# Patient Record
Sex: Female | Born: 1960 | Race: White | Hispanic: No | Marital: Married
Health system: Northeastern US, Academic
[De-identification: ages and names within clinical notes are randomized; demographics above are authoritative.]

---

## 2018-11-30 IMAGING — CR XR LUMBAR SPINE 2-3 VIEWS
3 series · 3 of 3 positions shown · non-contrast
Comparison: none

EXAM: LUMBOSACRAL SPINE:
CLINICAL INDICATION:  back pain
REFERENCE:  05/26/2010

[AP]
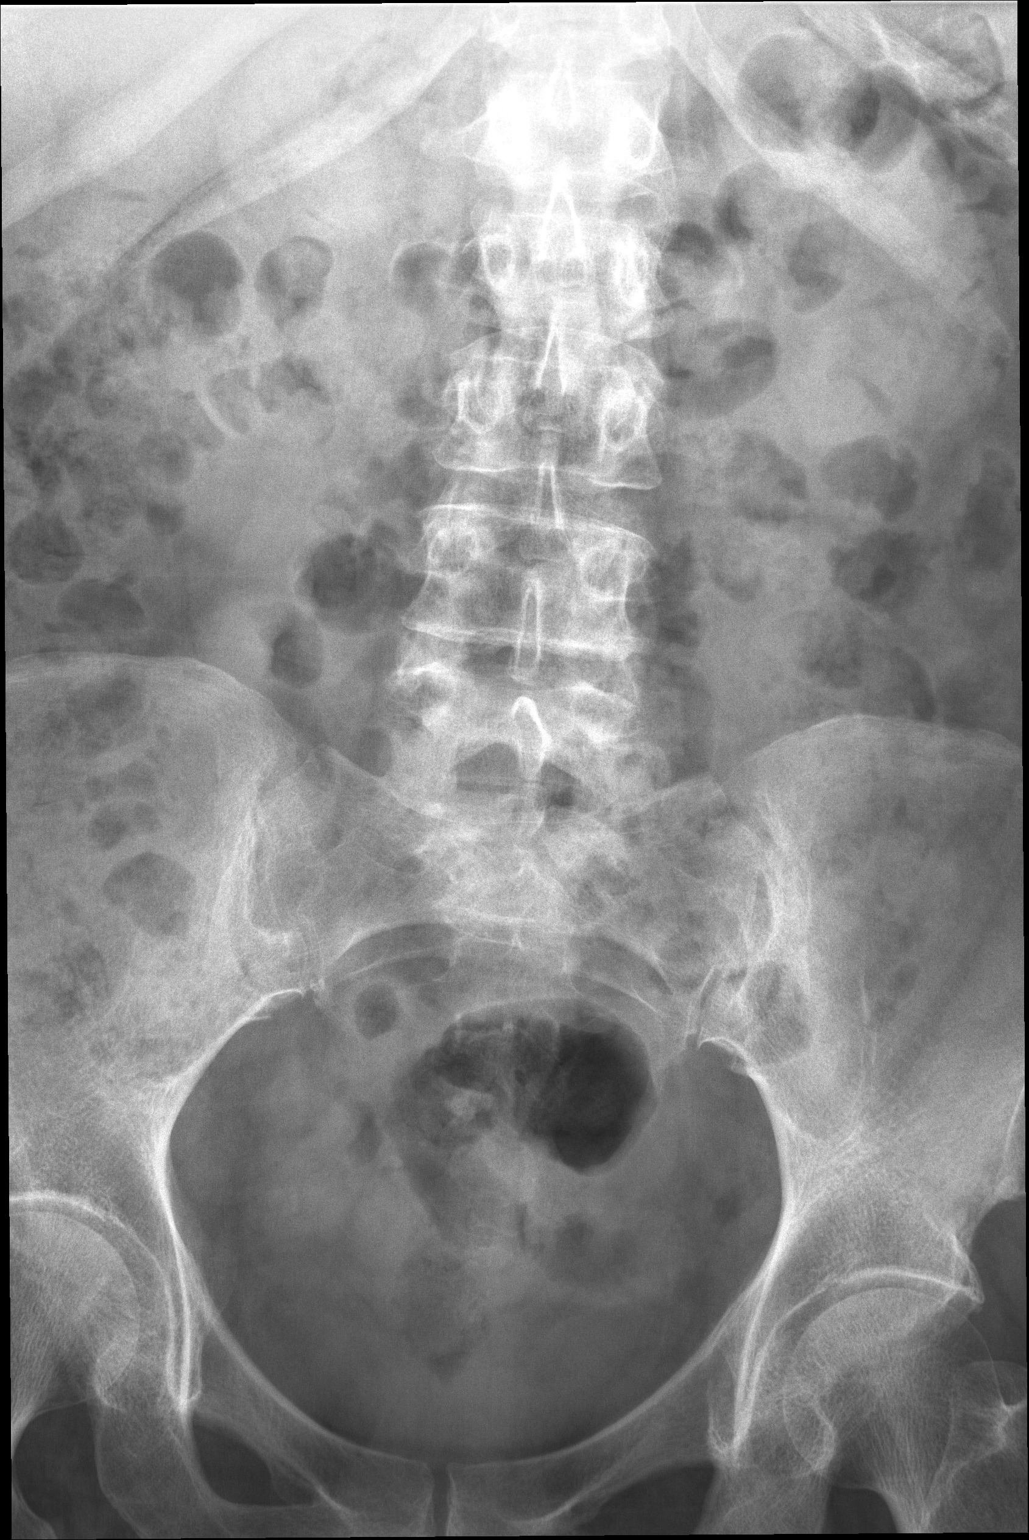

[lateral (1 of 2)]
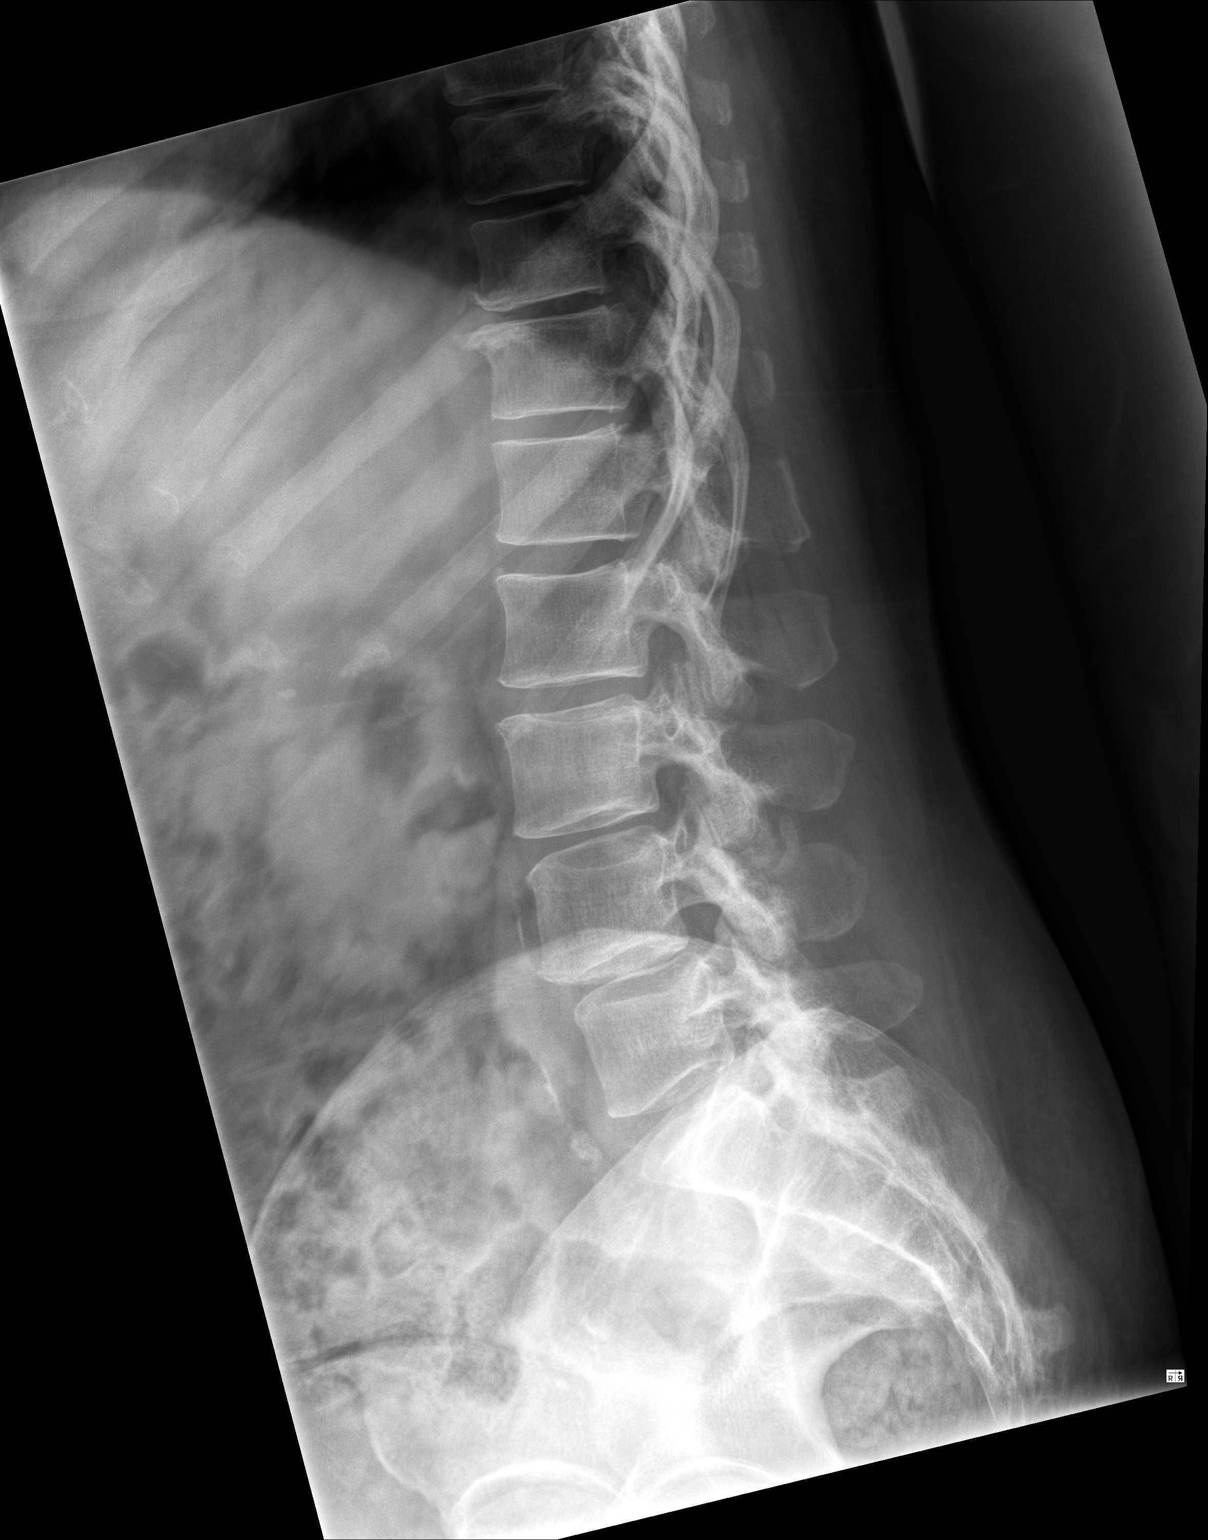

[lateral (2 of 2)]
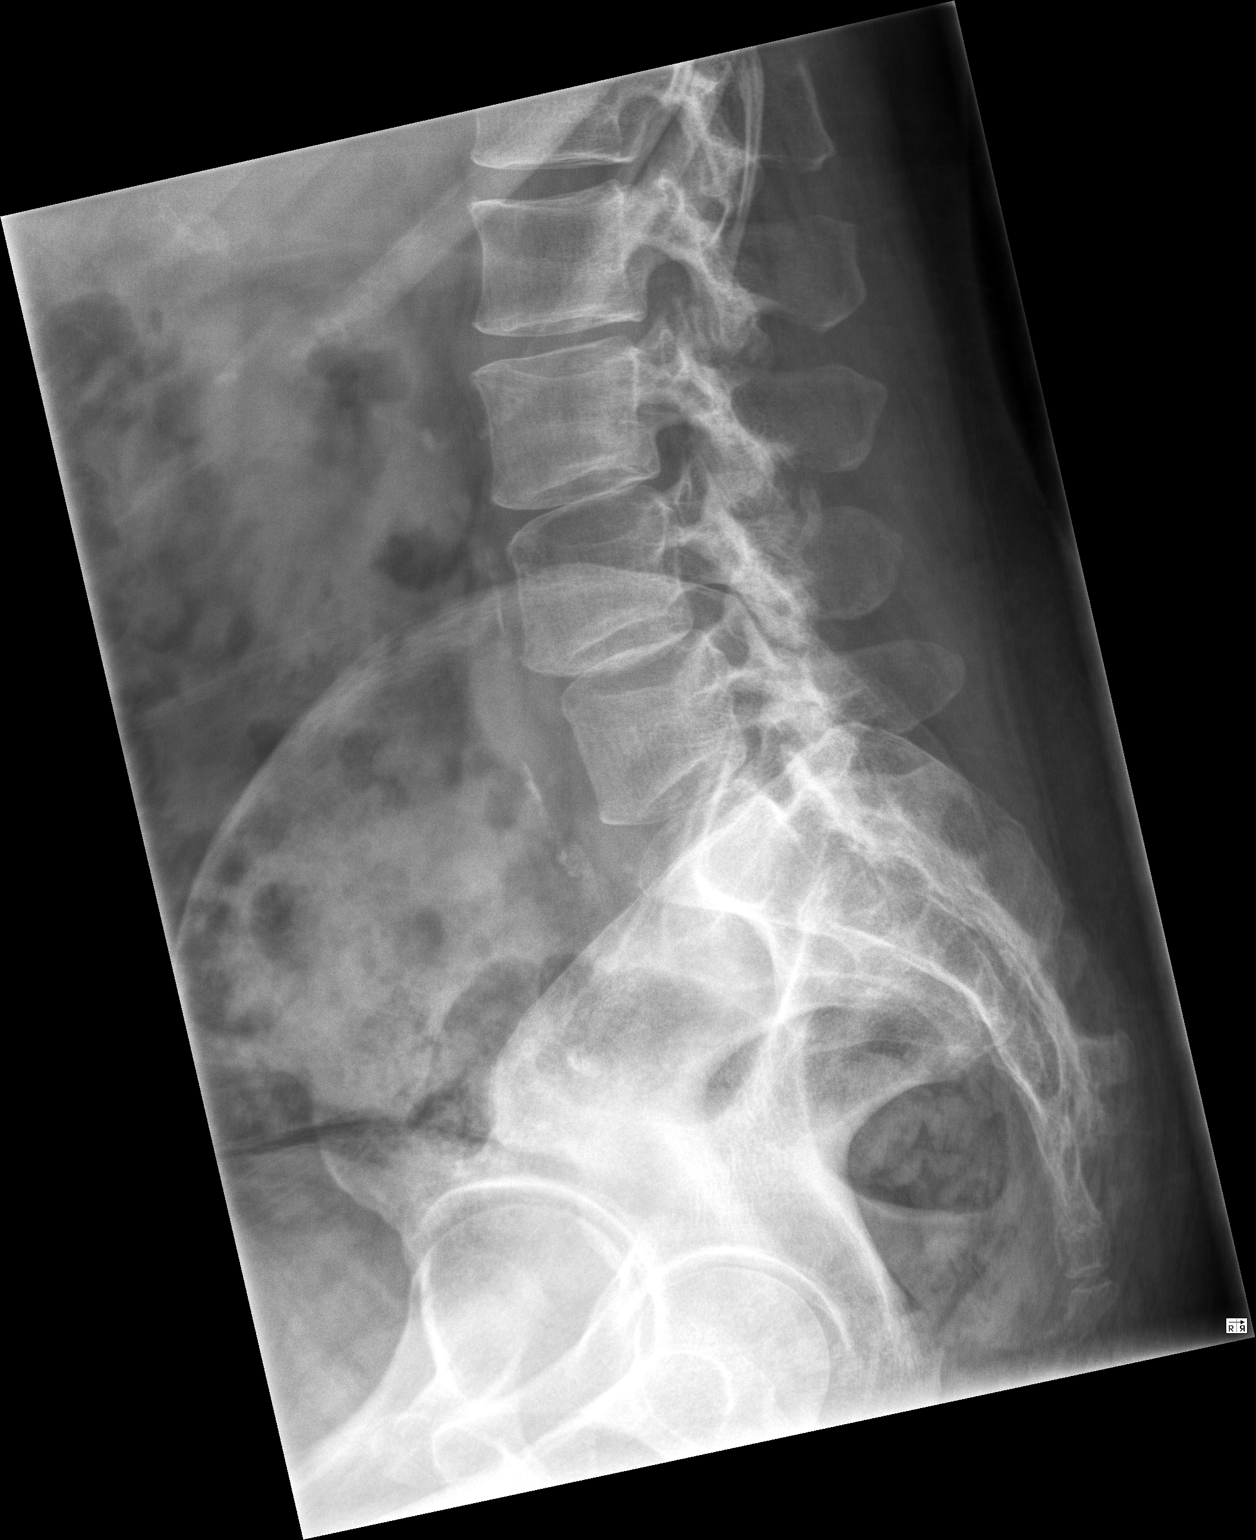

[3 of 3 positions shown; findings below may reference images not displayed]

FINDINGS: AP, lateral and spot lateral views of the lumbar spine are presented.
Bone mineralization is normal. Alignment is anatomic. There is no acute fracture, subluxation or dislocation.
There is progressive mild anterior vertebral body spurring and subchondral sclerosis with subtle anterior disc space narrowing at T11-12.
There is subtle new disc space narrowing and spurring at L2-3, L3-4 and L4-5. There are no other visible degenerative changes. There is mild aortoiliac vascular calcification.
IMPRESSION: 1. Degenerative change, progressive.
2. No acute fracture or dislocation.
LOCATION CODE : 4
Is the patient pregnant?
No

## 2018-11-30 IMAGING — CR XR HIPS BILATERAL 3 OR 4 VW WITH OR WITHOUT PELVIS
4 series · 4 of 4 positions shown · non-contrast
Comparison: none

BILATERAL HIPS:
REFERENCE:  None
CLINICAL INDICATION: Hip pain, osteoarthritis suspected
Left Hip pain, recent fall

[AP (1 of 2)]
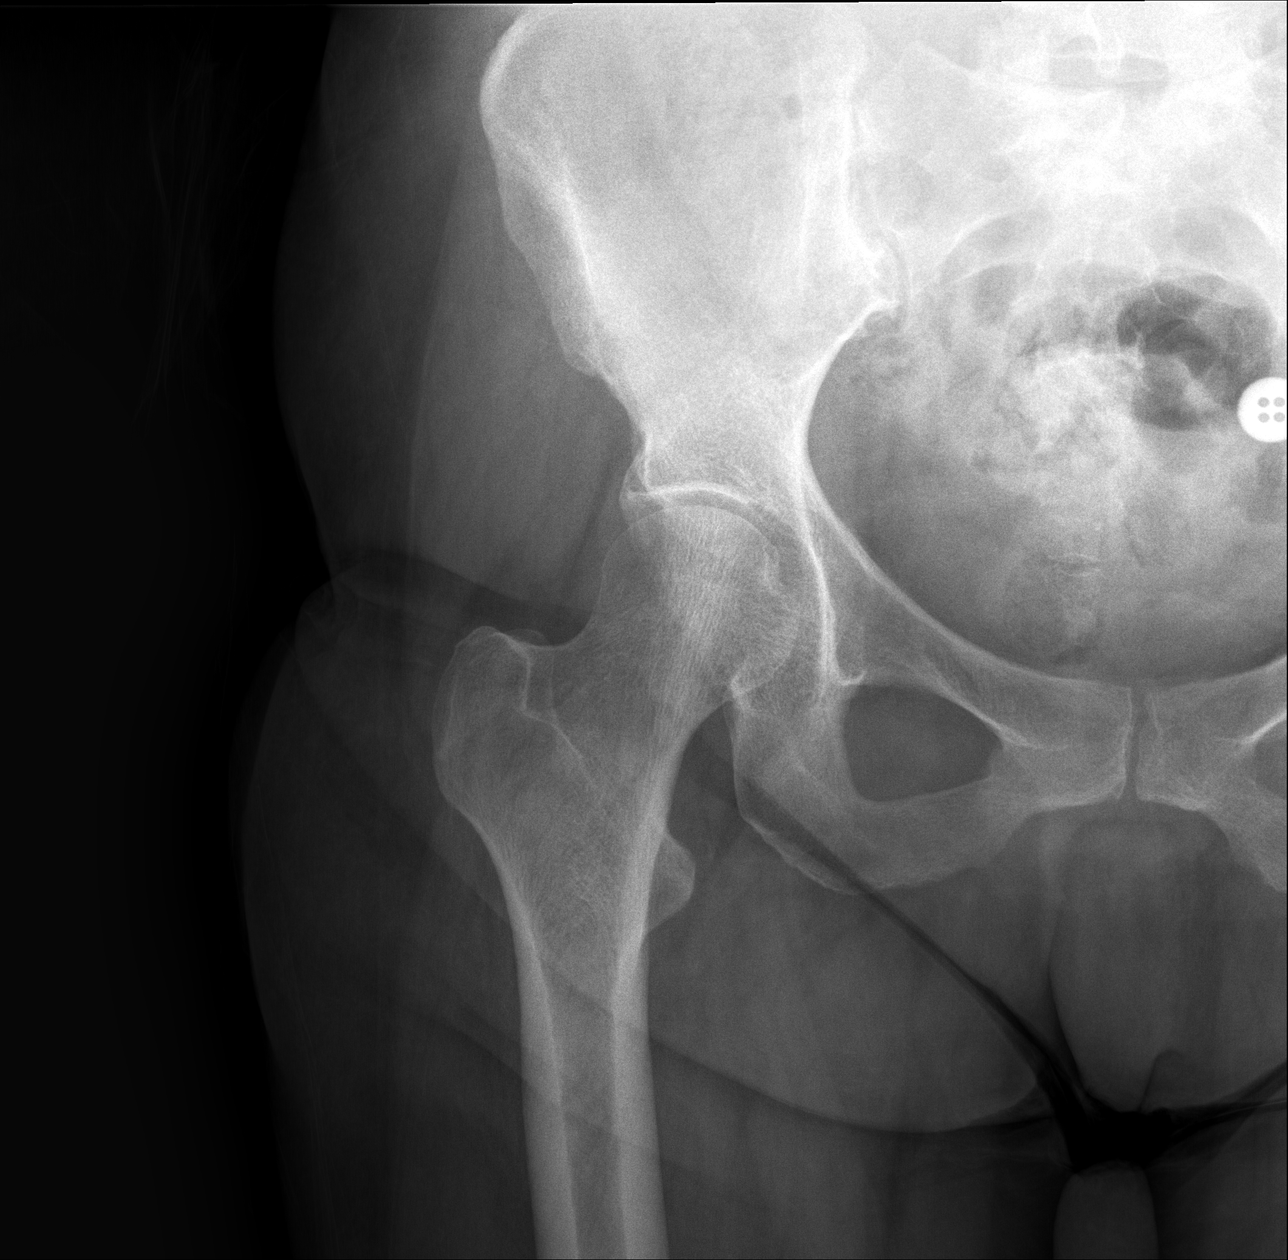

[right lateral]
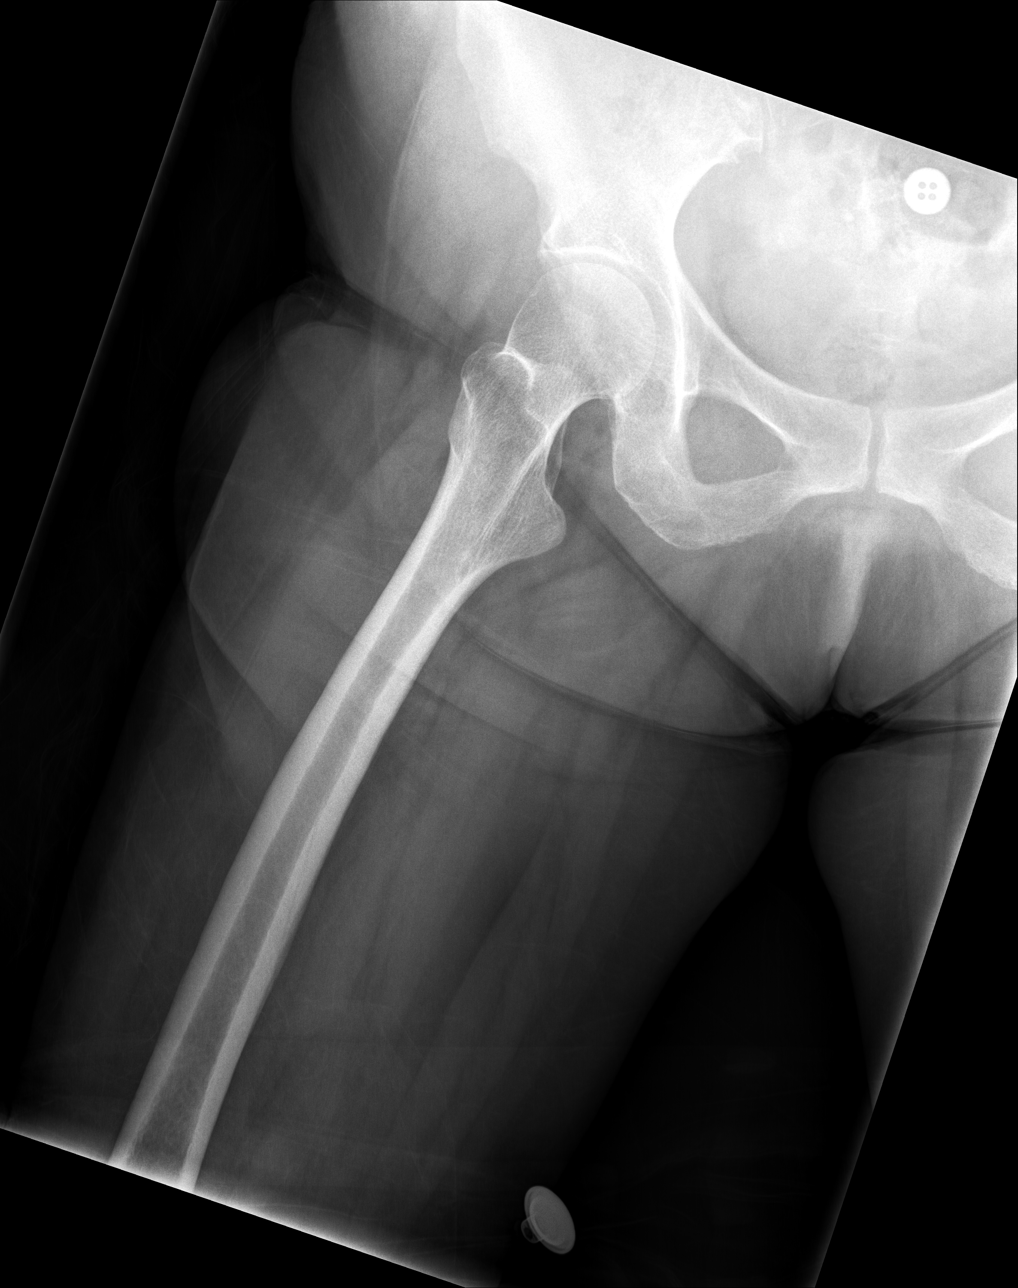

[AP (2 of 2)]
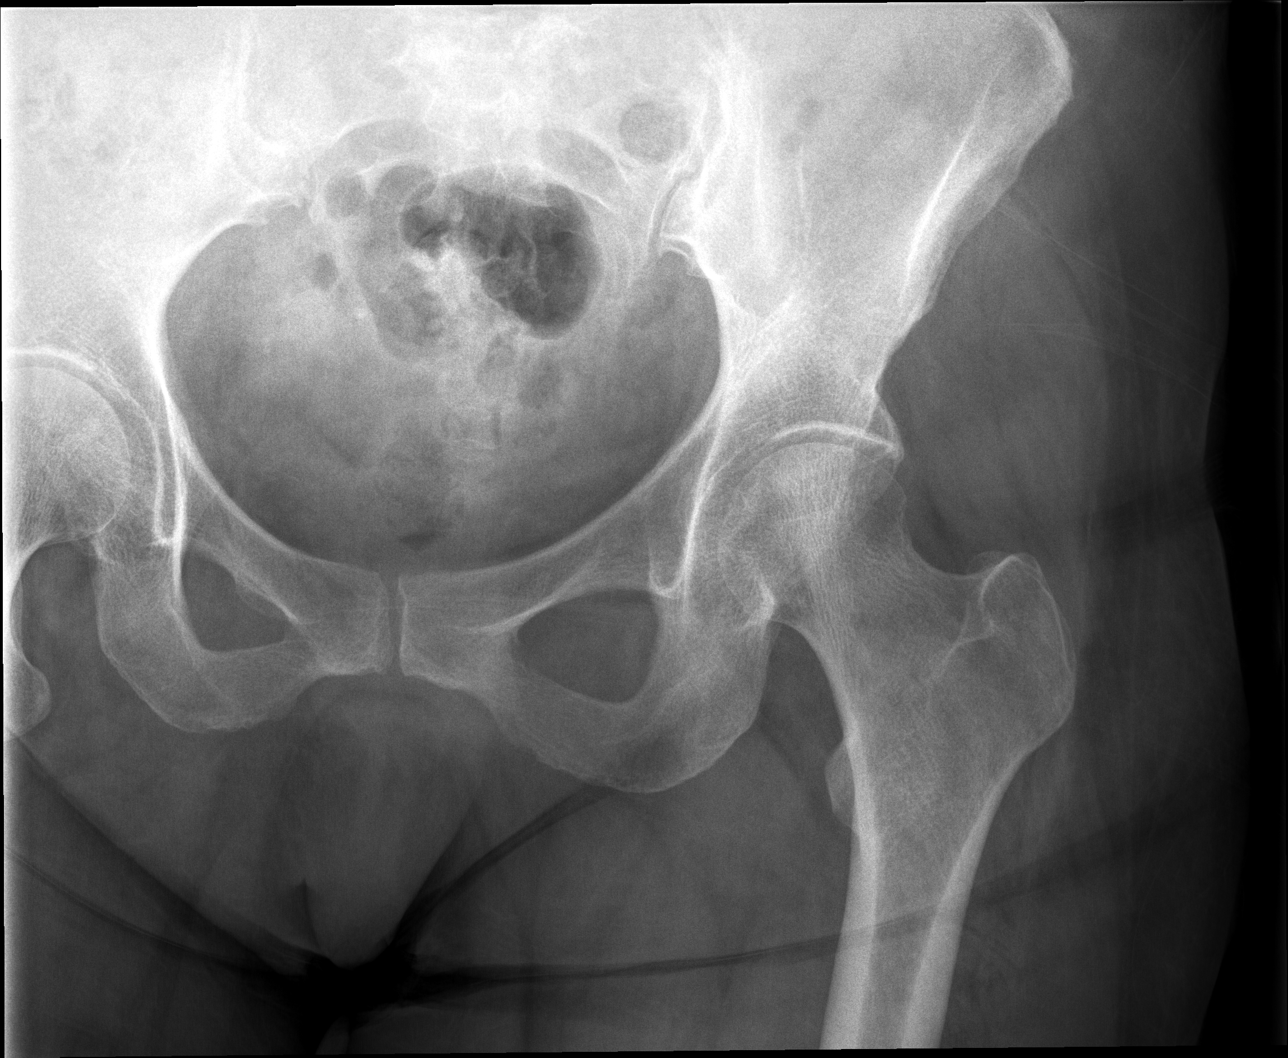

[left lateral]
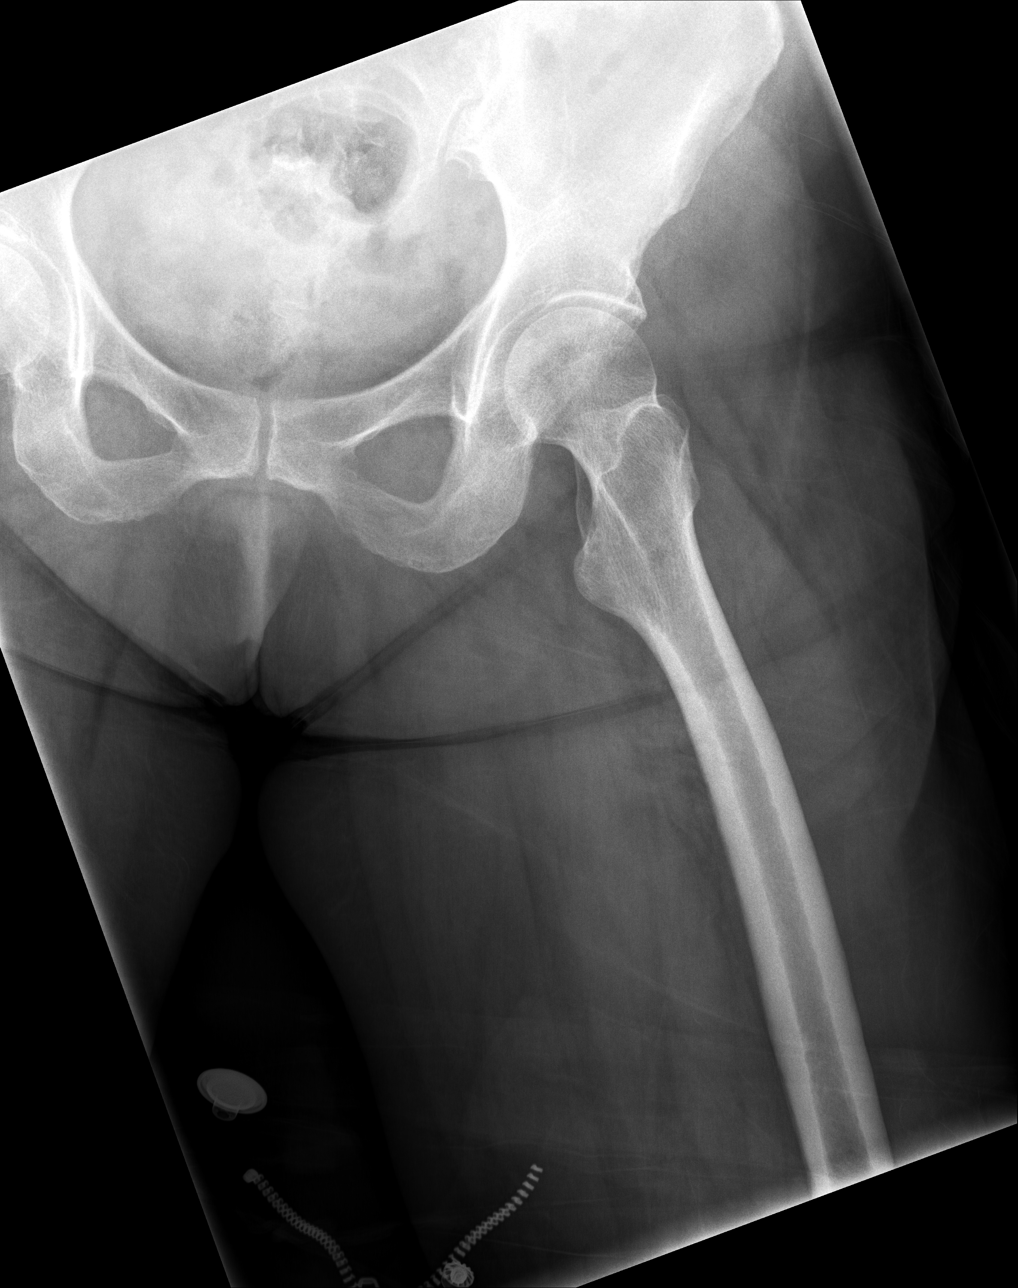

[4 of 4 positions shown; findings below may reference images not displayed]

FINDINGS: AP and frog-leg lateral views of both hips are presented. Bone mineralization appears normal.
There is no visible acute fracture or dislocation.
The hip joint spaces appear normal and symmetric.
The sacroiliac joints and pubic symphysis appear normal for age.
The surrounding soft tissues appear normal.
IMPRESSION: Normal hips.
LOCATION CODE: 4
Is the patient pregnant?
No

## 2019-09-24 IMAGING — CT CT HEAD WO CONTRAST
3 of 4 series · 17 of 30 positions shown, 19 images · non-contrast
Comparison: none

Exam: CT of the brain and ventricles
CT of the brain and ventricles was performed without IV contrast.
Review is made of the exam of 08/19/2008. Review is made of the MRI of the brain and ventricles from 07/09/2015.
Cystic lesion is seen adjacent to the lateral ventricle on the left. This measures approximately 3 cm. This is similar to the previous exam. This likely represents a porencephalic cyst. No positive mass effect, midline shift, or cerebral hemorrhage. No acute CNS changes. Bony calvarium is grossly intact.

[Series 4: brain without · axial · non-contrast · 0.46mm/px · z∈[+98,+214]mm · 6 of 36 slices shown, 8 images (1 of 2)]
[im 6/36  brain]
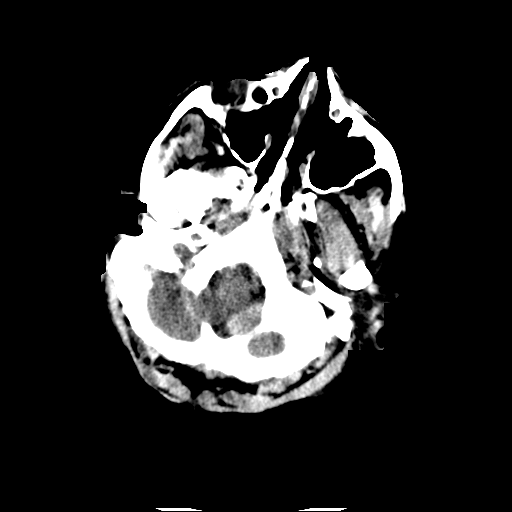
[im 6/36  bone]
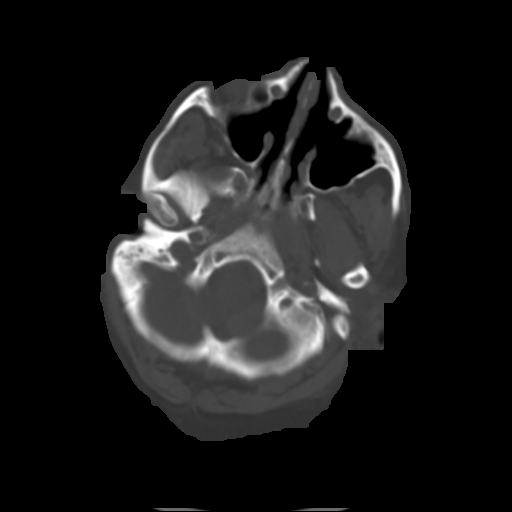
[im 11/36  brain]
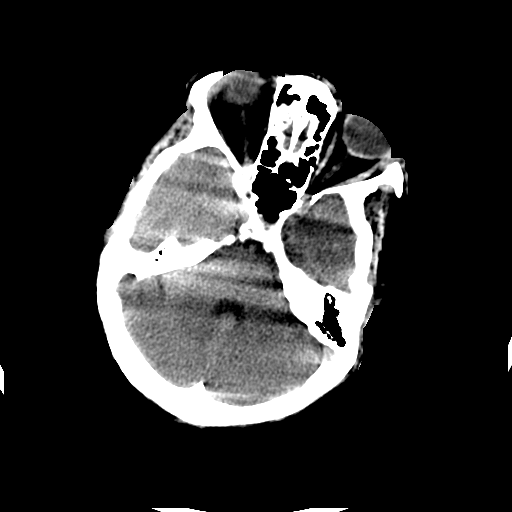
[im 16/36  brain]
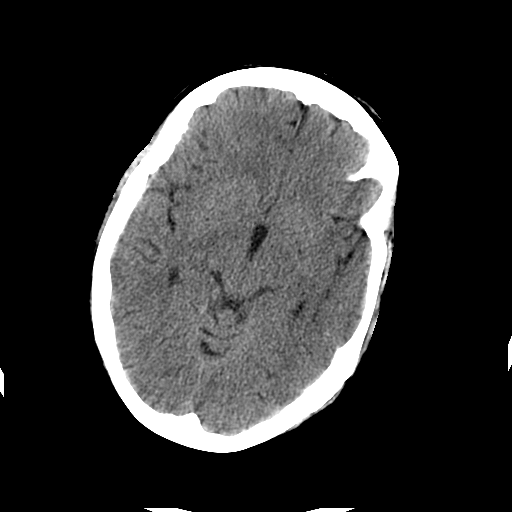
[im 21/36  brain]
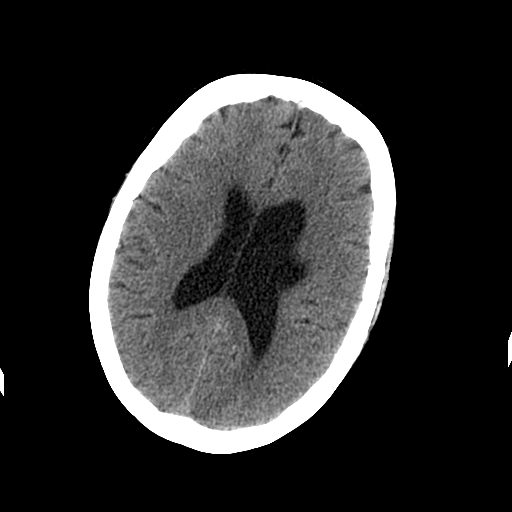
[im 26/36  brain]
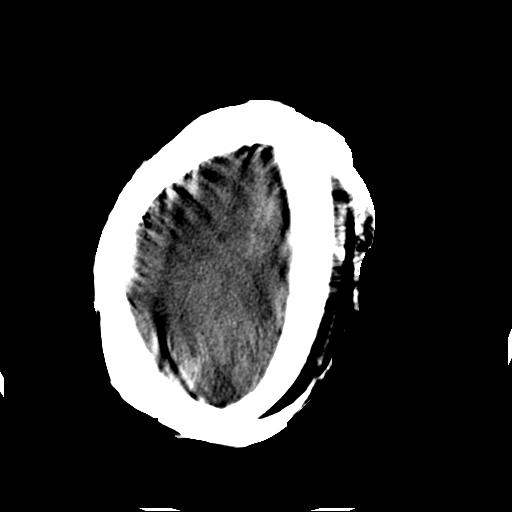
[im 26/36  bone]
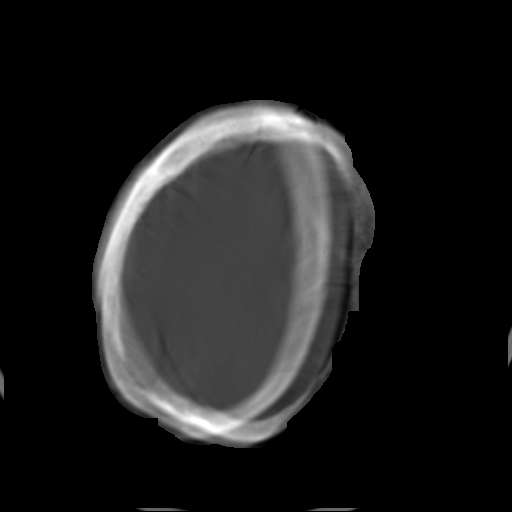
[im 31/36  brain]
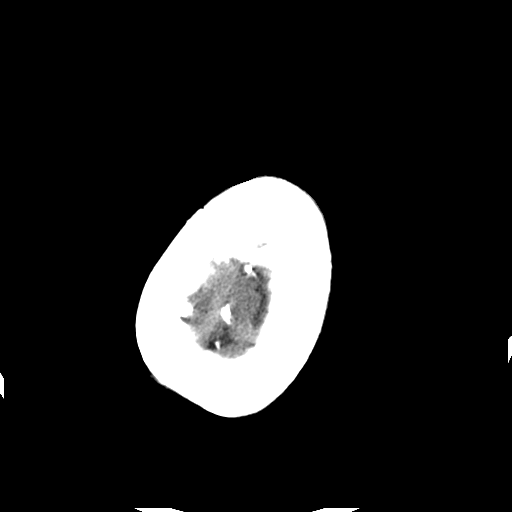

[Series 5: bone · axial · 0.46mm/px · z∈[+98,+214]mm · 6 of 36 slices shown]
[im 6/36  bone]
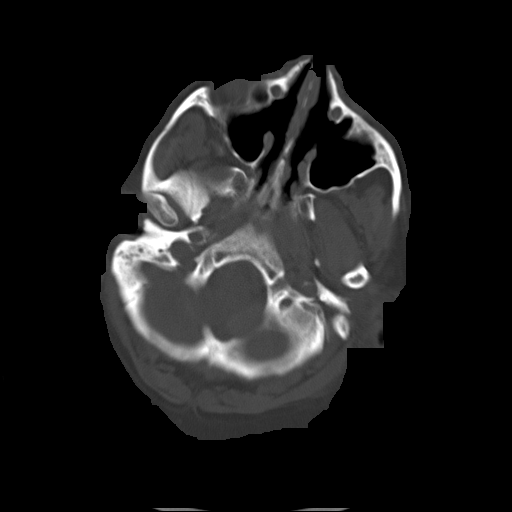
[im 11/36  bone]
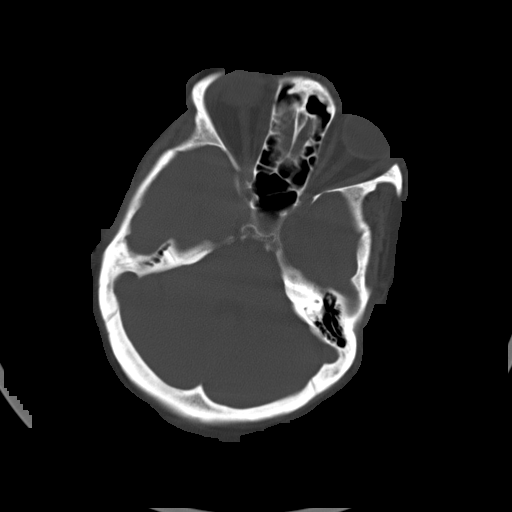
[im 16/36  bone]
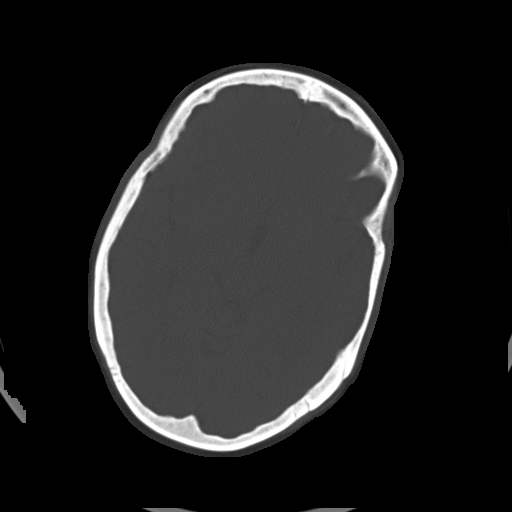
[im 21/36  bone]
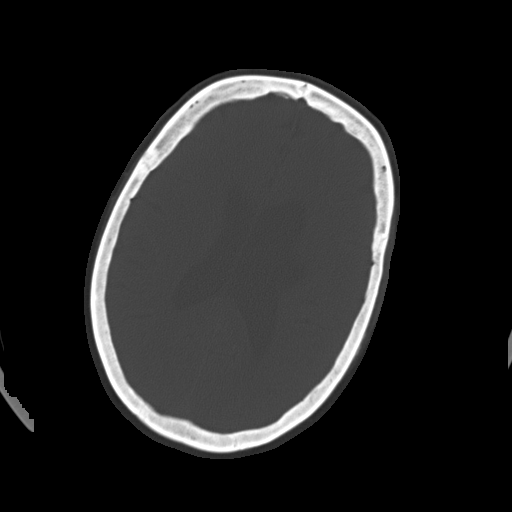
[im 26/36  bone]
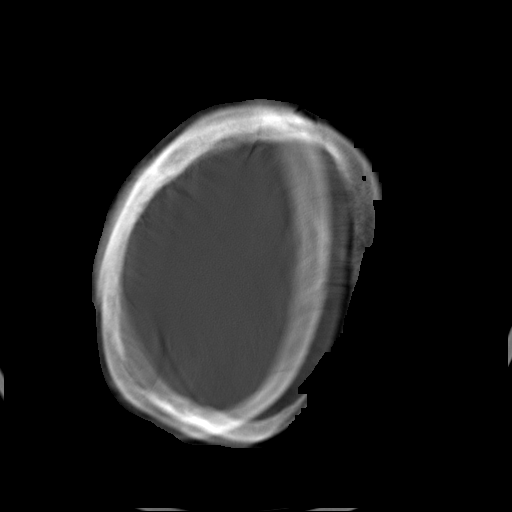
[im 31/36  bone]
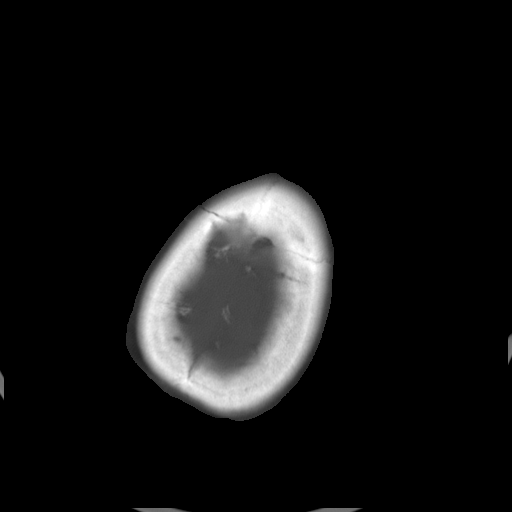

[Series 9: brain without · axial · non-contrast · 0.49mm/px · z∈[+74,+174]mm · 5 of 32 slices shown (2 of 2)]
[im 6/32  brain]
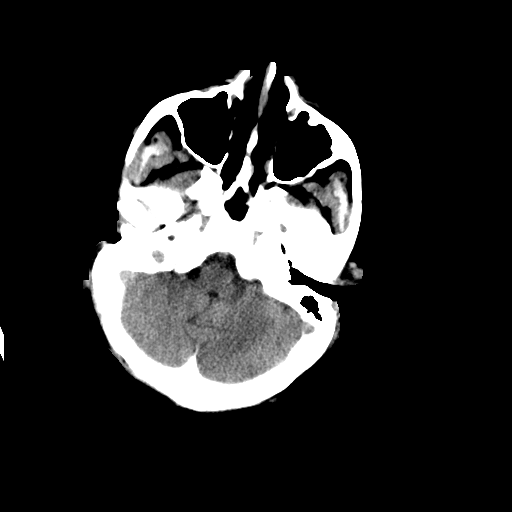
[im 11/32  brain]
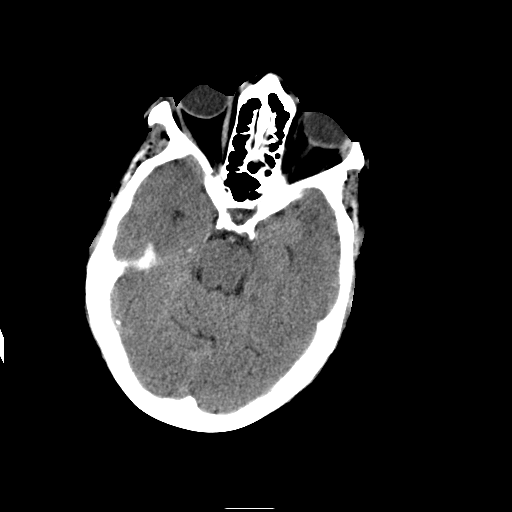
[im 16/32  brain]
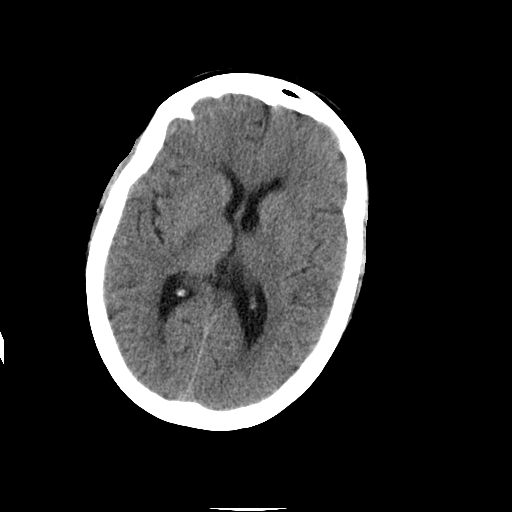
[im 21/32  brain]
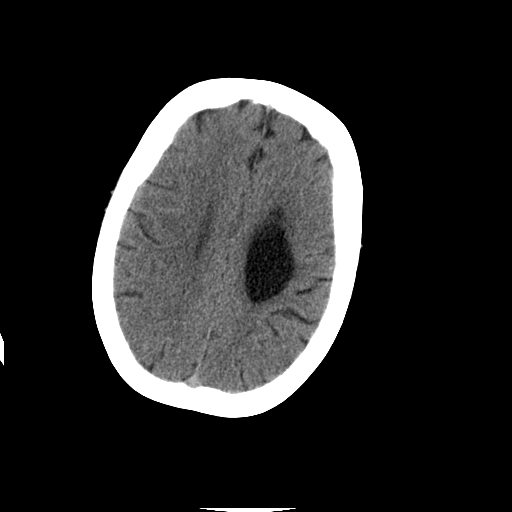
[im 26/32  brain]
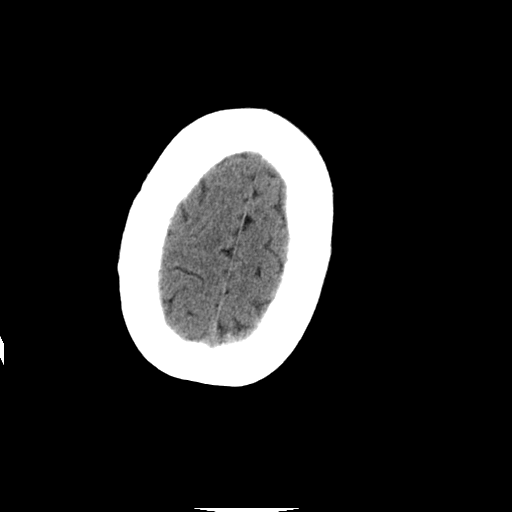

[17 of 30 positions shown; findings below may reference images not displayed]

IMPRESSION: Cystic area adjacent to the left lateral ventricle. This may represent porencephalic cyst. Stable dating back to 5886. No acute CNS changes.
Is the patient pregnant?
No

## 2019-09-24 IMAGING — CR XR CHEST 1 VIEW
1 series · 1 of 1 positions shown · non-contrast
Comparison: none

Exam: Chest one view
Today's exam is compared to the previous study of 10/31/2016.
Heart is normal in size. Mediastinal contours within normal limits. No edema. No pneumothorax. Mild scoliosis.

[AP]
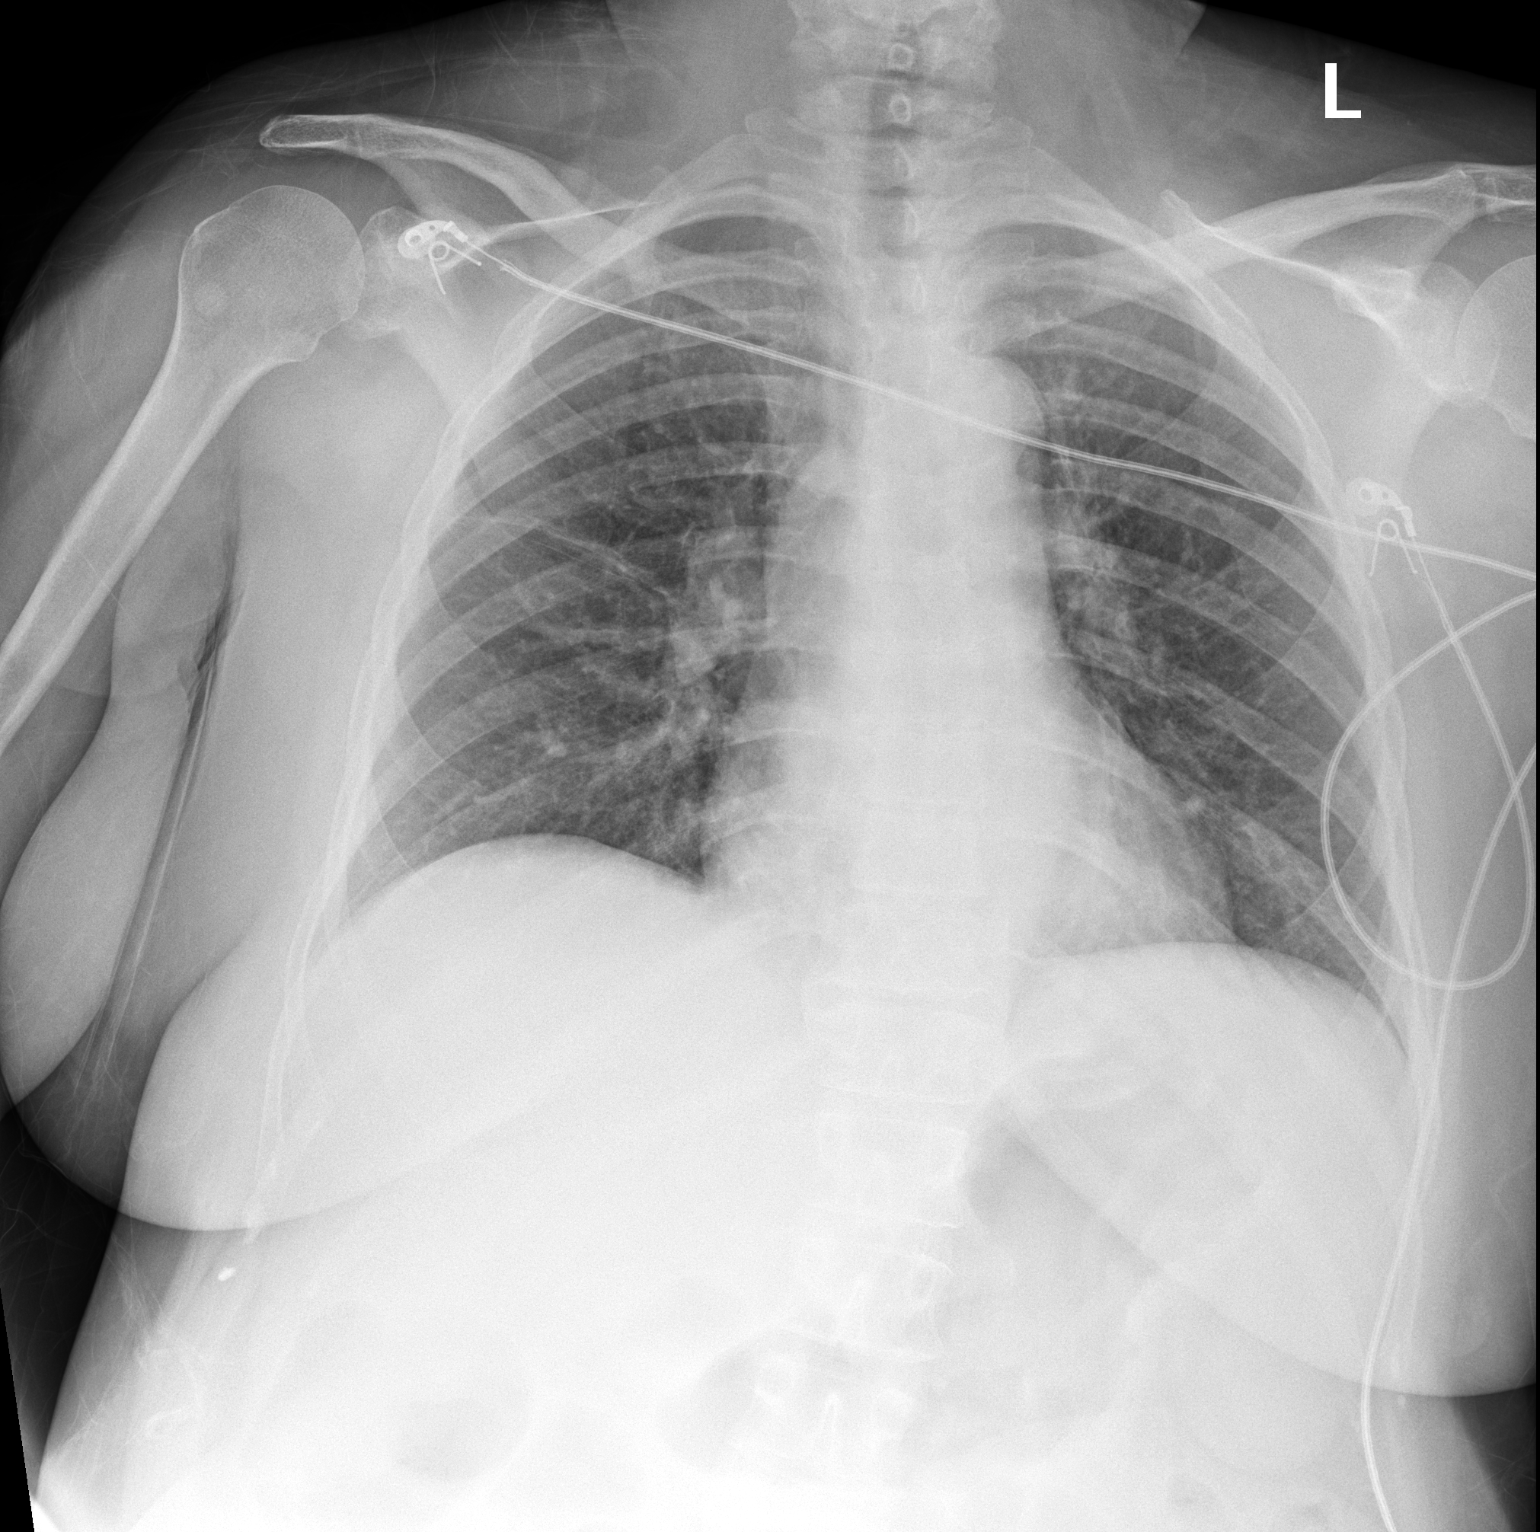

[1 of 1 positions shown; findings below may reference images not displayed]

IMPRESSION: No acute cardia pulmonary process.
Is the patient pregnant?
No

## 2019-09-25 IMAGING — MR MRI BRAIN WO CONTRAST
7 of 12 series · 31 of 48 positions shown · non-contrast
Comparison: none

MRI brain examination without contrast.
Multiplanar multisequence MRI images of brain obtained. This exams compared with 07/09/2015 exam. There is some motion artifact on these images.
There is a focal area of compensatory enlargement at the lateral aspect body of left lateral ventricle as on prior examination. There is hyperintensity demonstrated mild extent within the left ventricle white matter on T2 and FLAIR images as on prior examination. There are small, subcentimeter, foci and small number of foci of hyperintensity involving deep white matter right frontal which are unchanged. There is no diffusion restriction. There is no intracranial hemorrhage. There is no intracranial mass effect.

[Series 2: survey_mpr_sag · sagittal · 1.6mm · 1.60mm/px · 2 of 5 slices shown]
[im 1/5]
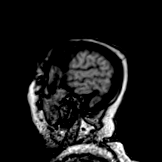
[im 5/5]
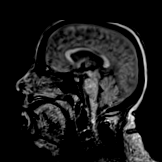

[Series 5: T1 · sagittal · 5.0mm · 0.45mm/px · 4 of 24 slices shown (1 of 3)]
[im 1/24]
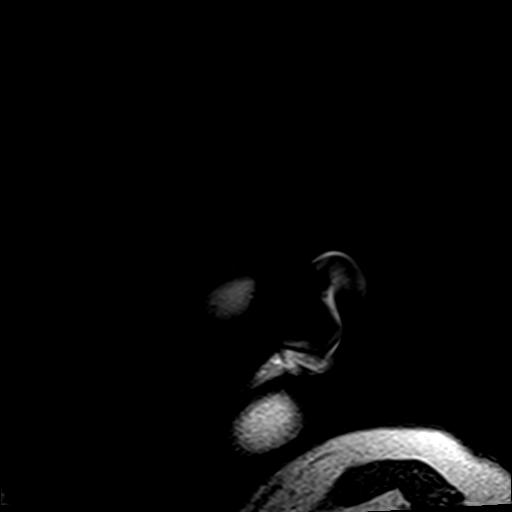
[im 8/24]
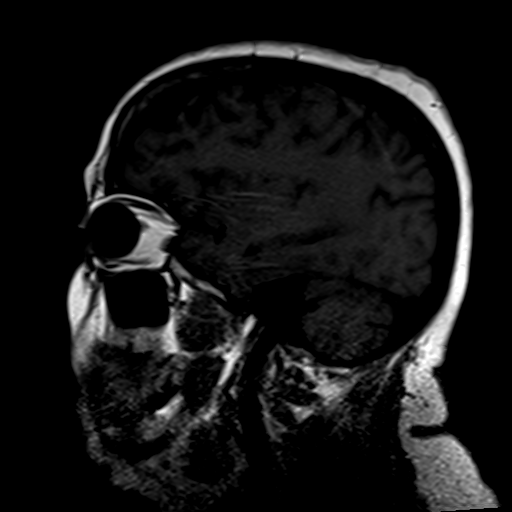
[im 16/24]
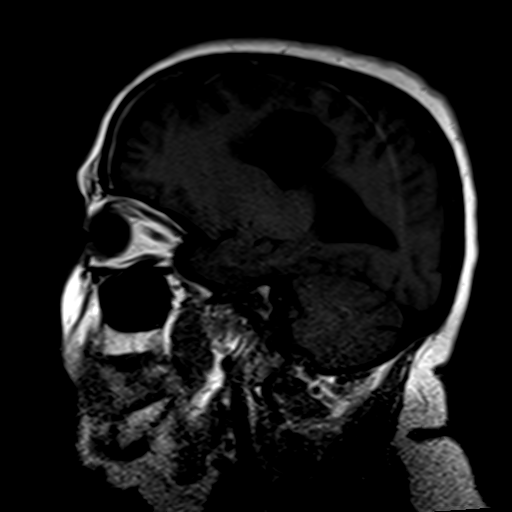
[im 24/24]
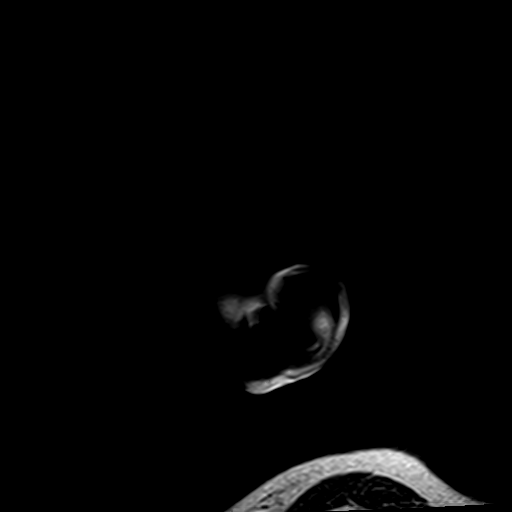

[Series 9: T1 · axial · 4.0mm · 0.45mm/px · z∈[-18,+143]mm · 5 of 30 slices shown (2 of 3)]
[im 1/30]
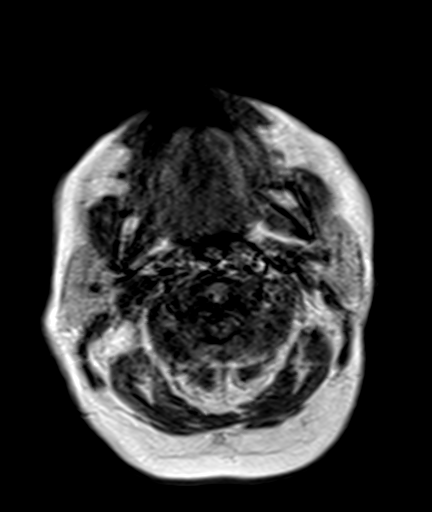
[im 8/30]
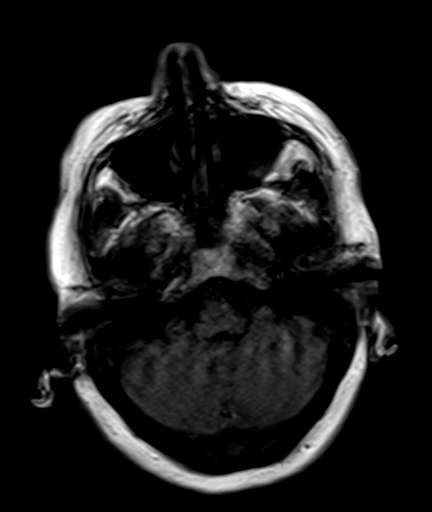
[im 15/30]
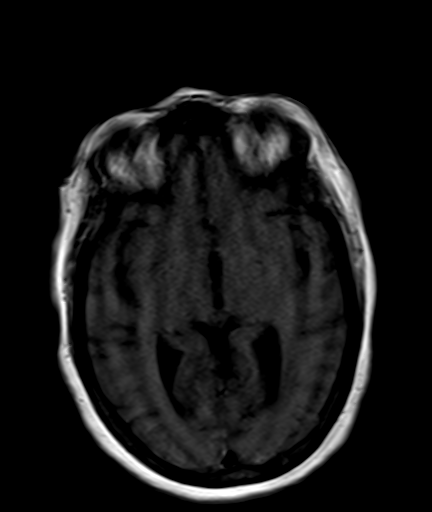
[im 22/30]
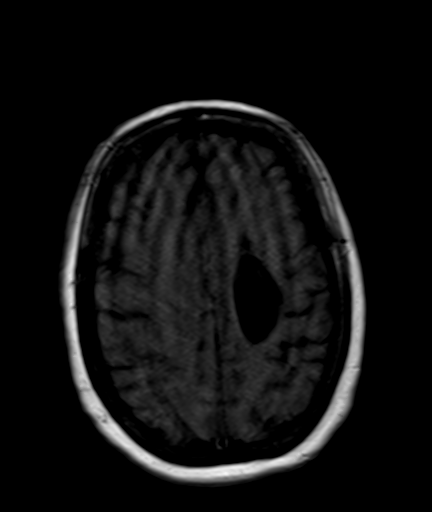
[im 30/30]
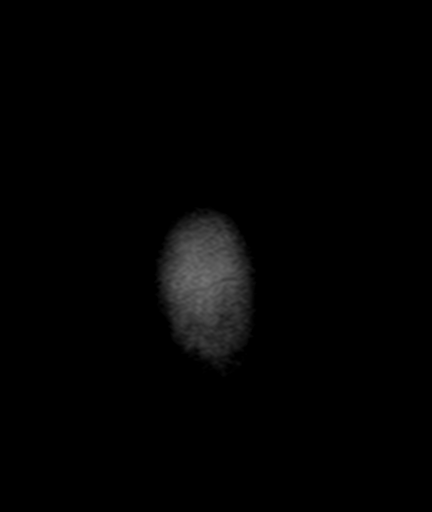

[Series 10: FLAIR · axial · 4.0mm · 0.90mm/px · z∈[-18,+143]mm · 5 of 30 slices shown]
[im 1/30]
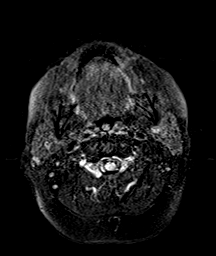
[im 8/30]
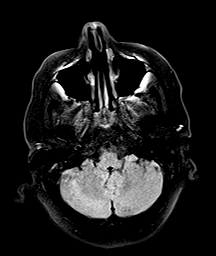
[im 15/30]
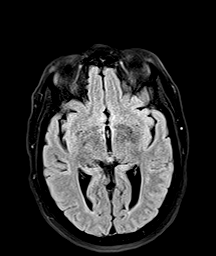
[im 22/30]
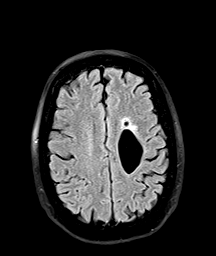
[im 30/30]
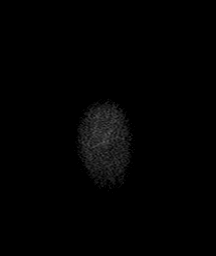

[Series 11: GRE · axial · 4.0mm · 0.45mm/px · z∈[-18,+143]mm · 5 of 30 slices shown]
[im 1/30]
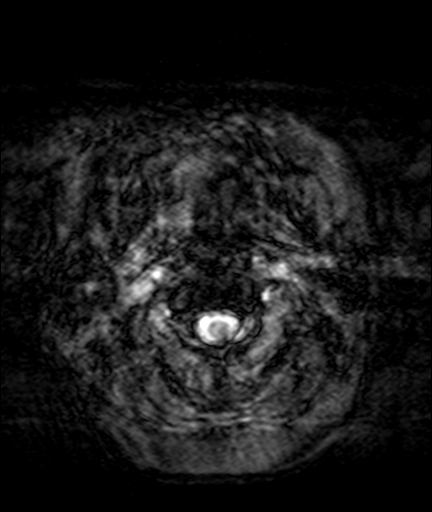
[im 8/30]
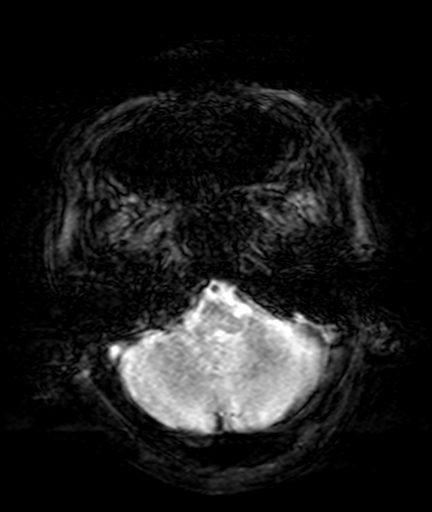
[im 15/30]
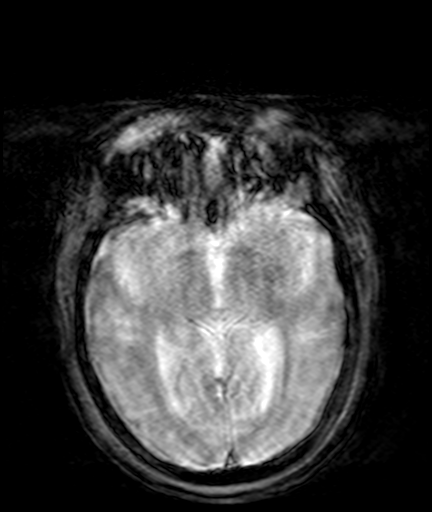
[im 22/30]
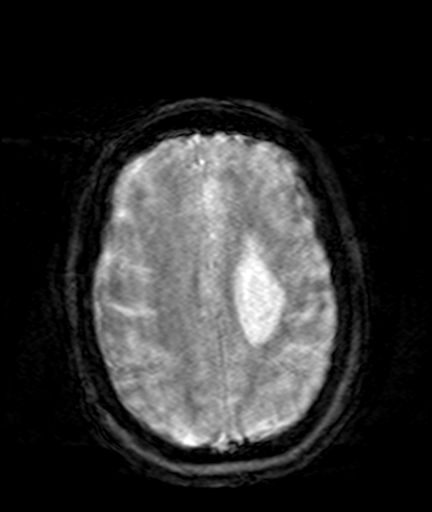
[im 30/30]
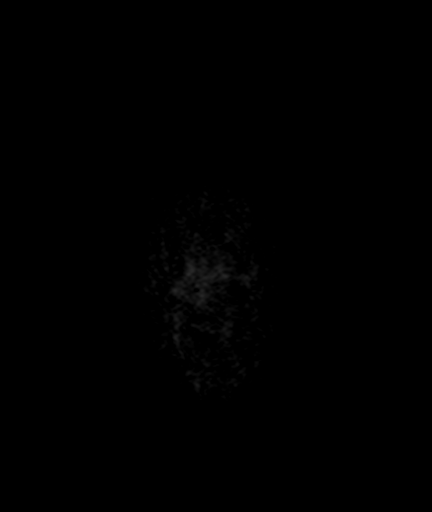

[Series 12: T2 · axial · 4.0mm · 0.90mm/px · z∈[-18,+143]mm · 5 of 30 slices shown]
[im 1/30]
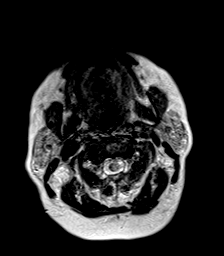
[im 8/30]
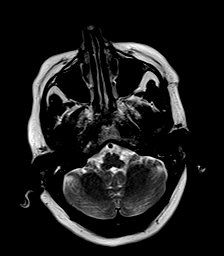
[im 15/30]
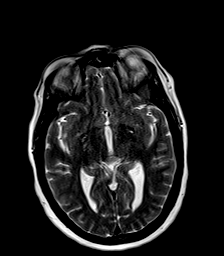
[im 22/30]
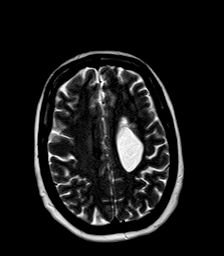
[im 30/30]
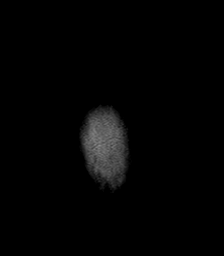

[Series 13: T1 · axial · 4.0mm · 0.45mm/px · z∈[-18,+143]mm · 5 of 30 slices shown (3 of 3)]
[im 1/30]
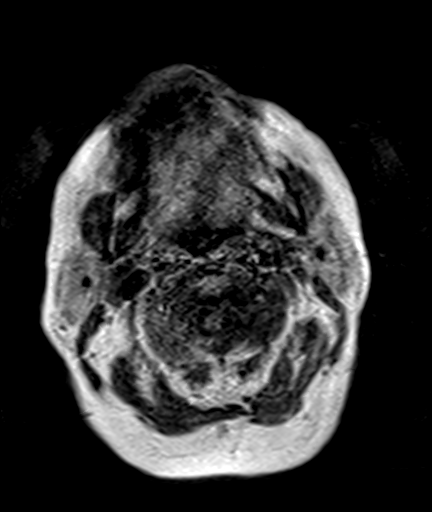
[im 8/30]
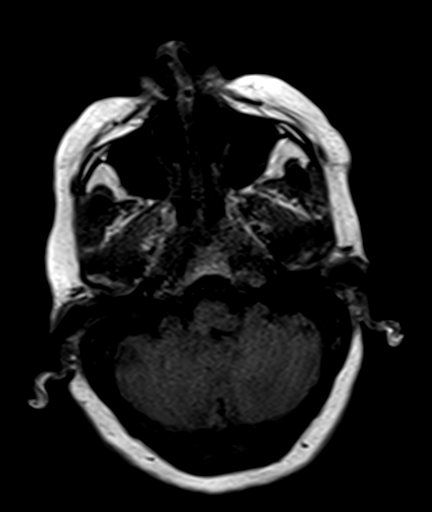
[im 15/30]
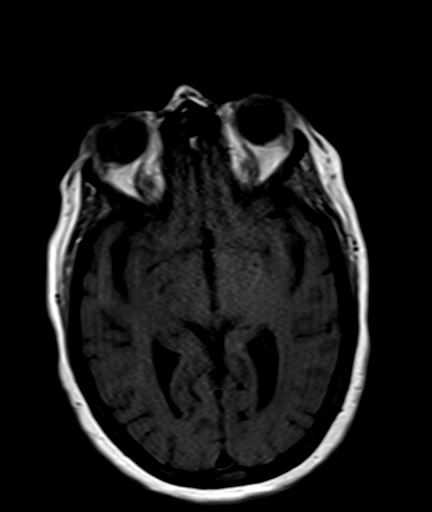
[im 22/30]
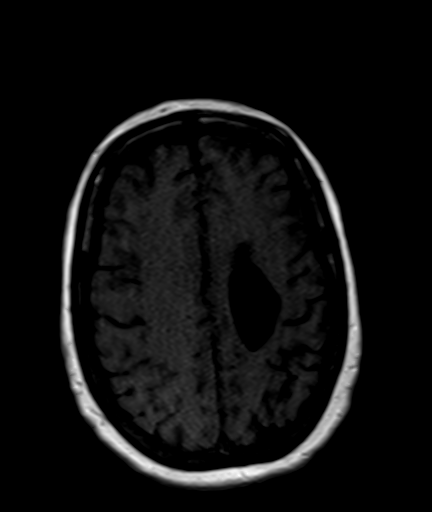
[im 30/30]
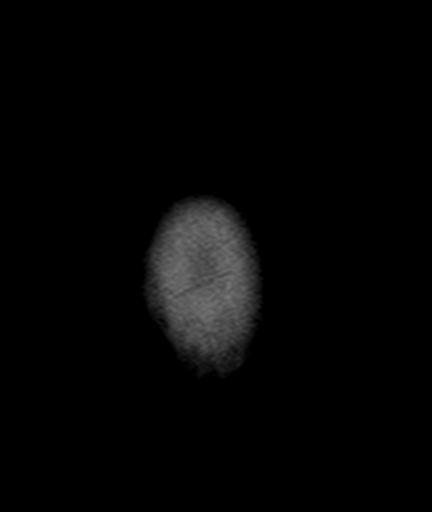

[31 of 48 positions shown; findings below may reference images not displayed]

IMPRESSION: No acute ischemic insult. There is some abnormal contour suggesting compensatory enlargement part of left lateral ventricle which is unchanged from prior examination and there is mild extent of deep white matter signal abnormality most likely related to mild microvascular disease.
.
Location
1.
Is the patient pregnant?
Unknown

## 2019-10-22 ENCOUNTER — Encounter: Admit: 2019-10-22 | Payer: PRIVATE HEALTH INSURANCE | Attending: Rheumatology

## 2019-10-23 ENCOUNTER — Encounter: Admit: 2019-10-23 | Payer: PRIVATE HEALTH INSURANCE | Attending: Rheumatology

## 2019-10-23 MED ORDER — GABAPENTIN 100 MG CAPSULE
100 mg | ORAL_CAPSULE | Freq: Every evening | ORAL | 6 refills | Status: AC
Start: 2019-10-23 — End: 2020-05-22

## 2019-10-23 MED ORDER — GABAPENTIN 400 MG CAPSULE
400 mg | ORAL_CAPSULE | Freq: Every evening | ORAL | 6 refills | Status: AC
Start: 2019-10-23 — End: 2020-05-10

## 2019-10-23 NOTE — Telephone Encounter
Last visit 1/5/21Next visit 7/20/21Results for Laurie White, Laurie White (MRN [id number]) as of 10/23/2019 13:30 Ref. Range 09/05/2019 09:28 Sodium Latest Ref Range: 135 - 146 mmol/L 140 Potassium Latest Ref Range: 3.5 - 5.3 mmol/L 4.2 Chloride Latest Ref Range: 98 - 110 mmol/L 102 CO2 Latest Ref Range: 20 - 32 mmol/L 29 BUN Latest Ref Range: 7 - 25 mg/dL 15 Creatinine Latest Ref Range: 0.50 - 1.05 mg/dL 6.29 BUN/Creatinine Ratio Latest Ref Range: 6 - 22 (calc) NOT APPLICABLE eGFR (NON African-American) Latest Ref Range: > OR = 60 mL/min/1.76m2 84 eGFR (Afr Amer) Latest Ref Range: > OR = 60 mL/min/1.28m2 97 Glucose Latest Ref Range: 65 - 99 mg/dL 528 (H) Calcium Latest Ref Range: 8.6 - 10.4 mg/dL 9.1 Bilirubin, Total Latest Ref Range: 0.2 - 1.2 mg/dL 0.7 Alkaline Phosphatase Latest Ref Range: 37 - 153 U/L 57 ALT (SGPT) Latest Ref Range: 6 - 29 U/L 25 AST Latest Ref Range: 10 - 35 U/L 19 Albumin Latest Ref Range: 3.6 - 5.1 g/dL 4.2 Globulin Latest Ref Range: 1.9 - 3.7 g/dL (calc) 2.8 Albumin/Globulin Ratio Latest Ref Range: 1.0 - 2.5 (calc) 1.5 CRP Latest Ref Range: <8.0 mg/L 9.2 (H) Protein, Total Latest Ref Range: 6.1 - 8.1 g/dL 7.0 WBC Latest Ref Range: 3.8 - 10.8 Thousand/uL 5.5 RBC Latest Ref Range: 3.80 - 5.10 Million/uL 4.41 Hemoglobin Latest Ref Range: 11.7 - 15.5 g/dL 41.3 Hematocrit Latest Ref Range: 35.0 - 45.0 % 40.9 MCV Latest Ref Range: 80.0 - 100.0 fL 92.7 MCH Latest Ref Range: 27.0 - 33.0 pg 29.9 RDW-CV Latest Ref Range: 11.0 - 15.0 % 12.9 Platelets Latest Ref Range: 140 - 400 Thousand/uL 309 MPV Latest Ref Range: 7.5 - 12.5 fL 9.2 Neutrophils Latest Units: % 53.9 Lymphocytes Latest Units: % 34.2 Monocytes Latest Units: % 8.6 Eosinophils Latest Units: % 2.4 Basophils Latest Units: % 0.9 Neutrophils Absolute Latest Ref Range: 1,500 - 7,800 cells/uL 2,965 Lymphocytes Absolute Latest Ref Range: 850 - 3,900 cells/uL 1,881 Monocytes (Absolute) Latest Ref Range: 200 - 950 cells/uL 473 Eosinophils Absolute Latest Ref Range: 15 - 500 cells/uL 132 Basophils Absolute Latest Ref Range: 0 - 200 cells/uL 50 MCHC Latest Ref Range: 32.0 - 36.0 g/dL 24.4 Sed Rate By Modified Westergren Latest Ref Range: < OR = 30 mm/h 6

## 2019-10-23 NOTE — Telephone Encounter
Last visit 1/5/21Next visit 7/21/21Results for Laurie White, Laurie White (MRN [id number]) as of 10/23/2019 13:14 Ref. Range 09/05/2019 09:28 Sodium Latest Ref Range: 135 - 146 mmol/L 140 Potassium Latest Ref Range: 3.5 - 5.3 mmol/L 4.2 Chloride Latest Ref Range: 98 - 110 mmol/L 102 CO2 Latest Ref Range: 20 - 32 mmol/L 29 BUN Latest Ref Range: 7 - 25 mg/dL 15 Creatinine Latest Ref Range: 0.50 - 1.05 mg/dL 4.09 BUN/Creatinine Ratio Latest Ref Range: 6 - 22 (calc) NOT APPLICABLE eGFR (NON African-American) Latest Ref Range: > OR = 60 mL/min/1.70m2 84 eGFR (Afr Amer) Latest Ref Range: > OR = 60 mL/min/1.101m2 97 Glucose Latest Ref Range: 65 - 99 mg/dL 811 (H) Calcium Latest Ref Range: 8.6 - 10.4 mg/dL 9.1 Bilirubin, Total Latest Ref Range: 0.2 - 1.2 mg/dL 0.7 Alkaline Phosphatase Latest Ref Range: 37 - 153 U/L 57 ALT (SGPT) Latest Ref Range: 6 - 29 U/L 25 AST Latest Ref Range: 10 - 35 U/L 19 Albumin Latest Ref Range: 3.6 - 5.1 g/dL 4.2 Globulin Latest Ref Range: 1.9 - 3.7 g/dL (calc) 2.8 Albumin/Globulin Ratio Latest Ref Range: 1.0 - 2.5 (calc) 1.5 CRP Latest Ref Range: <8.0 mg/L 9.2 (H) Protein, Total Latest Ref Range: 6.1 - 8.1 g/dL 7.0 WBC Latest Ref Range: 3.8 - 10.8 Thousand/uL 5.5 RBC Latest Ref Range: 3.80 - 5.10 Million/uL 4.41 Hemoglobin Latest Ref Range: 11.7 - 15.5 g/dL 91.4 Hematocrit Latest Ref Range: 35.0 - 45.0 % 40.9 MCV Latest Ref Range: 80.0 - 100.0 fL 92.7 MCH Latest Ref Range: 27.0 - 33.0 pg 29.9 RDW-CV Latest Ref Range: 11.0 - 15.0 % 12.9 Platelets Latest Ref Range: 140 - 400 Thousand/uL 309 MPV Latest Ref Range: 7.5 - 12.5 fL 9.2 Neutrophils Latest Units: % 53.9 Lymphocytes Latest Units: % 34.2 Monocytes Latest Units: % 8.6 Eosinophils Latest Units: % 2.4 Basophils Latest Units: % 0.9 Neutrophils Absolute Latest Ref Range: 1,500 - 7,800 cells/uL 2,965 Lymphocytes Absolute Latest Ref Range: 850 - 3,900 cells/uL 1,881 Monocytes (Absolute) Latest Ref Range: 200 - 950 cells/uL 473 Eosinophils Absolute Latest Ref Range: 15 - 500 cells/uL 132 Basophils Absolute Latest Ref Range: 0 - 200 cells/uL 50 MCHC Latest Ref Range: 32.0 - 36.0 g/dL 78.2 Sed Rate By Modified Westergren Latest Ref Range: < OR = 30 mm/h 6

## 2019-11-03 ENCOUNTER — Encounter: Admit: 2019-11-03 | Payer: PRIVATE HEALTH INSURANCE | Attending: Rheumatology

## 2019-11-03 DIAGNOSIS — M542 Cervicalgia: Secondary | ICD-10-CM

## 2019-11-03 DIAGNOSIS — M797 Fibromyalgia: Secondary | ICD-10-CM

## 2019-11-03 DIAGNOSIS — E559 Vitamin D deficiency, unspecified: Secondary | ICD-10-CM

## 2019-11-03 DIAGNOSIS — M65329 Trigger finger, unspecified index finger: Secondary | ICD-10-CM

## 2019-11-03 DIAGNOSIS — Z791 Long term (current) use of non-steroidal anti-inflammatories (NSAID): Secondary | ICD-10-CM

## 2019-11-03 DIAGNOSIS — R7982 Elevated C-reactive protein (CRP): Secondary | ICD-10-CM

## 2019-11-03 DIAGNOSIS — M545 Low back pain, unspecified back pain laterality, unspecified chronicity, unspecified whether sciatica present: Secondary | ICD-10-CM

## 2019-11-03 DIAGNOSIS — M25551 Pain in right hip: Secondary | ICD-10-CM

## 2019-11-03 DIAGNOSIS — G5621 Lesion of ulnar nerve, right upper limb: Secondary | ICD-10-CM

## 2019-11-03 DIAGNOSIS — M8589 Other specified disorders of bone density and structure, multiple sites: Secondary | ICD-10-CM

## 2019-11-03 DIAGNOSIS — M47816 Spondylosis without myelopathy or radiculopathy, lumbar region: Secondary | ICD-10-CM

## 2019-11-03 DIAGNOSIS — G5600 Carpal tunnel syndrome, unspecified upper limb: Secondary | ICD-10-CM

## 2019-11-03 DIAGNOSIS — M47812 Spondylosis without myelopathy or radiculopathy, cervical region: Secondary | ICD-10-CM

## 2019-11-08 ENCOUNTER — Encounter: Admit: 2019-11-08 | Payer: PRIVATE HEALTH INSURANCE | Attending: Vascular and Interventional Radiology

## 2019-11-17 ENCOUNTER — Encounter: Admit: 2019-11-17 | Payer: PRIVATE HEALTH INSURANCE | Attending: Vascular and Interventional Radiology

## 2019-12-01 ENCOUNTER — Encounter: Admit: 2019-12-01 | Payer: PRIVATE HEALTH INSURANCE | Attending: Rheumatology

## 2019-12-01 DIAGNOSIS — G5621 Lesion of ulnar nerve, right upper limb: Secondary | ICD-10-CM

## 2019-12-01 DIAGNOSIS — M47812 Spondylosis without myelopathy or radiculopathy, cervical region: Secondary | ICD-10-CM

## 2019-12-01 DIAGNOSIS — M47816 Spondylosis without myelopathy or radiculopathy, lumbar region: Secondary | ICD-10-CM

## 2019-12-01 DIAGNOSIS — M25551 Pain in right hip: Secondary | ICD-10-CM

## 2019-12-01 DIAGNOSIS — M8589 Other specified disorders of bone density and structure, multiple sites: Secondary | ICD-10-CM

## 2019-12-01 DIAGNOSIS — E559 Vitamin D deficiency, unspecified: Secondary | ICD-10-CM

## 2019-12-01 DIAGNOSIS — R7982 Elevated C-reactive protein (CRP): Secondary | ICD-10-CM

## 2019-12-01 DIAGNOSIS — M65329 Trigger finger, unspecified index finger: Secondary | ICD-10-CM

## 2019-12-01 DIAGNOSIS — M797 Fibromyalgia: Secondary | ICD-10-CM

## 2019-12-01 DIAGNOSIS — M542 Cervicalgia: Secondary | ICD-10-CM

## 2019-12-01 DIAGNOSIS — Z791 Long term (current) use of non-steroidal anti-inflammatories (NSAID): Secondary | ICD-10-CM

## 2019-12-01 DIAGNOSIS — M545 Low back pain, unspecified back pain laterality, unspecified chronicity, unspecified whether sciatica present: Secondary | ICD-10-CM

## 2019-12-01 DIAGNOSIS — G5600 Carpal tunnel syndrome, unspecified upper limb: Secondary | ICD-10-CM

## 2019-12-04 ENCOUNTER — Telehealth: Admit: 2019-12-04 | Payer: PRIVATE HEALTH INSURANCE

## 2019-12-04 NOTE — Telephone Encounter
Patient calling for her lab orders to be faxed to [facility] in [place] for a Wednesday morning appointment.RN has faxed to the requested location.

## 2019-12-07 LAB — COMPREHENSIVE METABOLIC PANEL
ALBUMIN/GLOBULIN RATIO: 1.7 (calc) (ref 1.0–2.5)
ALBUMIN: 4.4 g/dL (ref 3.6–5.1)
ALKALINE PHOSPHATASE: 54 U/L (ref 37–153)
ALT (SGPT): 35 U/L — ABNORMAL HIGH (ref 6–29)
AST (SGOT): 22 U/L (ref 10–35)
BILIRUBIN, TOTAL: 0.9 mg/dL (ref 0.2–1.2)
BLOOD UREA NITROGEN: 15 mg/dL (ref 7–25)
CALCIUM: 9.4 mg/dL (ref 8.6–10.4)
CHLORIDE: 103 mmol/L (ref 98–110)
CO2: 26 mmol/L (ref 20–32)
CREATININE: 0.8 mg/dL (ref 0.50–1.05)
EGFR (AFR AMER): 96 mL/min/{1.73_m2} (ref >=60)
EGFR (NON AFRICAN AMERICAN): 82 mL/min/{1.73_m2} (ref >=60)
GLOBULIN: 2.6 g/dL (ref 1.9–3.7)
GLUCOSE: 93 mg/dL (ref 65–139)
POTASSIUM: 4.4 mmol/L (ref 3.5–5.3)
PROTEIN, TOTAL, SPEP: 7 g/dL (ref 6.1–8.1)
SODIUM: 138 mmol/L (ref 135–146)

## 2019-12-07 LAB — CBC AND DIFFERENTIAL
BASOPHILS ABSOLUTE COUNT: 32 {cells}/uL (ref 0–200)
BASOPHILS: 0.6 %
EOSINOPHILS ABSOLUTE COUNT: 108 {cells}/uL (ref 15–500)
EOSINOPHILS: 2 %
HEMATOCRIT BLOOD: 41.4 % (ref 35.0–45.0)
HEMOGLOBIN: 13.8 g/dL (ref 11.7–15.5)
LYMPHOCYTES ABSOLUTE COUNT: 1674 {cells}/uL (ref 850–3900)
LYMPHOCYTES: 31 %
MCH: 30.5 pg (ref 27.0–33.0)
MCHC-HEMOGLOBINOPATHY: 33.3 g/dL (ref 32.0–36.0)
MCV: 91.6 fL (ref 80.0–100.0)
MONOCYTES ABSOLUTE COUNT: 481 {cells}/uL (ref 200–950)
MONOCYTES: 8.9 %
MPV: 9.3 fL (ref 7.5–12.5)
NEUTROPHILS ABSOLUTE COUNT: 3105 {cells}/uL (ref 1500–7800)
NEUTROPHILS: 57.5 %
PLATELET COUNT: 314 10*3/uL (ref 140–400)
RDW: 14.3 % (ref 11.0–15.0)
RED BLOOD CELL COUNT: 4.52 10*6/uL (ref 3.80–5.10)
WHITE BLOOD CELL COUNT: 5.4 10*3/uL (ref 3.8–10.8)

## 2019-12-07 LAB — C-REACTIVE PROTEIN     (CRP): C-REACTIVE PROTEIN: 8.6 mg/L — ABNORMAL HIGH (ref <8.0)

## 2019-12-07 LAB — SEDIMENTATION RATE (ESR): SED RATE BY MODIFIED WESTERGREN: 6 mm/h (ref <=30)

## 2019-12-29 ENCOUNTER — Encounter: Admit: 2019-12-29 | Payer: PRIVATE HEALTH INSURANCE | Attending: Rheumatology

## 2019-12-29 DIAGNOSIS — M47816 Spondylosis without myelopathy or radiculopathy, lumbar region: Secondary | ICD-10-CM

## 2019-12-29 DIAGNOSIS — G5621 Lesion of ulnar nerve, right upper limb: Secondary | ICD-10-CM

## 2019-12-29 DIAGNOSIS — M25551 Pain in right hip: Secondary | ICD-10-CM

## 2019-12-29 DIAGNOSIS — E559 Vitamin D deficiency, unspecified: Secondary | ICD-10-CM

## 2019-12-29 DIAGNOSIS — G5600 Carpal tunnel syndrome, unspecified upper limb: Secondary | ICD-10-CM

## 2019-12-29 DIAGNOSIS — R7982 Elevated C-reactive protein (CRP): Secondary | ICD-10-CM

## 2019-12-29 DIAGNOSIS — M542 Cervicalgia: Secondary | ICD-10-CM

## 2019-12-29 DIAGNOSIS — M65329 Trigger finger, unspecified index finger: Secondary | ICD-10-CM

## 2019-12-29 DIAGNOSIS — M8589 Other specified disorders of bone density and structure, multiple sites: Secondary | ICD-10-CM

## 2019-12-29 DIAGNOSIS — M797 Fibromyalgia: Secondary | ICD-10-CM

## 2019-12-29 DIAGNOSIS — M545 Low back pain, unspecified back pain laterality, unspecified chronicity, unspecified whether sciatica present: Secondary | ICD-10-CM

## 2019-12-29 DIAGNOSIS — Z791 Long term (current) use of non-steroidal anti-inflammatories (NSAID): Secondary | ICD-10-CM

## 2019-12-29 DIAGNOSIS — M47812 Spondylosis without myelopathy or radiculopathy, cervical region: Secondary | ICD-10-CM

## 2020-01-26 ENCOUNTER — Encounter: Admit: 2020-01-26 | Payer: PRIVATE HEALTH INSURANCE | Attending: Rheumatology

## 2020-01-26 DIAGNOSIS — M797 Fibromyalgia: Secondary | ICD-10-CM

## 2020-01-26 DIAGNOSIS — M545 Low back pain, unspecified back pain laterality, unspecified chronicity, unspecified whether sciatica present: Secondary | ICD-10-CM

## 2020-01-26 DIAGNOSIS — M542 Cervicalgia: Secondary | ICD-10-CM

## 2020-01-26 DIAGNOSIS — G5621 Lesion of ulnar nerve, right upper limb: Secondary | ICD-10-CM

## 2020-01-26 DIAGNOSIS — Z791 Long term (current) use of non-steroidal anti-inflammatories (NSAID): Secondary | ICD-10-CM

## 2020-01-26 DIAGNOSIS — G5600 Carpal tunnel syndrome, unspecified upper limb: Secondary | ICD-10-CM

## 2020-01-26 DIAGNOSIS — M25551 Pain in right hip: Secondary | ICD-10-CM

## 2020-01-26 DIAGNOSIS — M47816 Spondylosis without myelopathy or radiculopathy, lumbar region: Secondary | ICD-10-CM

## 2020-01-26 DIAGNOSIS — M65329 Trigger finger, unspecified index finger: Secondary | ICD-10-CM

## 2020-01-26 DIAGNOSIS — E559 Vitamin D deficiency, unspecified: Secondary | ICD-10-CM

## 2020-01-26 DIAGNOSIS — R7982 Elevated C-reactive protein (CRP): Secondary | ICD-10-CM

## 2020-01-26 DIAGNOSIS — M8589 Other specified disorders of bone density and structure, multiple sites: Secondary | ICD-10-CM

## 2020-01-26 DIAGNOSIS — M47812 Spondylosis without myelopathy or radiculopathy, cervical region: Secondary | ICD-10-CM

## 2020-02-23 ENCOUNTER — Encounter: Admit: 2020-02-23 | Payer: PRIVATE HEALTH INSURANCE | Attending: Rheumatology

## 2020-02-23 DIAGNOSIS — R7982 Elevated C-reactive protein (CRP): Secondary | ICD-10-CM

## 2020-02-23 DIAGNOSIS — M47816 Spondylosis without myelopathy or radiculopathy, lumbar region: Secondary | ICD-10-CM

## 2020-02-23 DIAGNOSIS — M542 Cervicalgia: Secondary | ICD-10-CM

## 2020-02-23 DIAGNOSIS — M25551 Pain in right hip: Secondary | ICD-10-CM

## 2020-02-23 DIAGNOSIS — M8589 Other specified disorders of bone density and structure, multiple sites: Secondary | ICD-10-CM

## 2020-02-23 DIAGNOSIS — M545 Low back pain, unspecified back pain laterality, unspecified chronicity, unspecified whether sciatica present: Secondary | ICD-10-CM

## 2020-02-23 DIAGNOSIS — M65329 Trigger finger, unspecified index finger: Secondary | ICD-10-CM

## 2020-02-23 DIAGNOSIS — Z791 Long term (current) use of non-steroidal anti-inflammatories (NSAID): Secondary | ICD-10-CM

## 2020-02-23 DIAGNOSIS — G5621 Lesion of ulnar nerve, right upper limb: Secondary | ICD-10-CM

## 2020-02-23 DIAGNOSIS — M47812 Spondylosis without myelopathy or radiculopathy, cervical region: Secondary | ICD-10-CM

## 2020-02-23 DIAGNOSIS — G5600 Carpal tunnel syndrome, unspecified upper limb: Secondary | ICD-10-CM

## 2020-02-23 DIAGNOSIS — M797 Fibromyalgia: Secondary | ICD-10-CM

## 2020-02-23 DIAGNOSIS — E559 Vitamin D deficiency, unspecified: Secondary | ICD-10-CM

## 2020-02-24 ENCOUNTER — Encounter: Admit: 2020-02-24 | Payer: PRIVATE HEALTH INSURANCE | Attending: Rheumatology

## 2020-02-24 DIAGNOSIS — E559 Vitamin D deficiency, unspecified: Secondary | ICD-10-CM

## 2020-02-27 NOTE — Telephone Encounter
Last visit 1/5/21Next visit 7/20/21Results for TERRALYN, MATSUMURA (MRN [id number]) as of 02/26/2020 14:00 Ref. Range 12/06/2019 10:31 Sodium Latest Ref Range: 135 - 146 mmol/L 138 Potassium Latest Ref Range: 3.5 - 5.3 mmol/L 4.4 Chloride Latest Ref Range: 98 - 110 mmol/L 103 CO2 Latest Ref Range: 20 - 32 mmol/L 26 BUN Latest Ref Range: 7 - 25 mg/dL 15 Creatinine Latest Ref Range: 0.50 - 1.05 mg/dL 2.95 BUN/Creatinine Ratio Latest Ref Range: 6 - 22 (calc) NOT APPLICABLE eGFR (NON African-American) Latest Ref Range: > OR = 60 mL/min/1.68m2 82 eGFR (Afr Amer) Latest Ref Range: > OR = 60 mL/min/1.73m2 96 Glucose Latest Ref Range: 65 - 139 mg/dL 93 Calcium Latest Ref Range: 8.6 - 10.4 mg/dL 9.4 Bilirubin, Total Latest Ref Range: 0.2 - 1.2 mg/dL 0.9 Alkaline Phosphatase Latest Ref Range: 37 - 153 U/L 54 ALT (SGPT) Latest Ref Range: 6 - 29 U/L 35 (H) AST Latest Ref Range: 10 - 35 U/L 22 Albumin Latest Ref Range: 3.6 - 5.1 g/dL 4.4 Globulin Latest Ref Range: 1.9 - 3.7 g/dL (calc) 2.6 Albumin/Globulin Ratio Latest Ref Range: 1.0 - 2.5 (calc) 1.7 CRP Latest Ref Range: <8.0 mg/L 8.6 (H) Protein, Total Latest Ref Range: 6.1 - 8.1 g/dL 7.0 WBC Latest Ref Range: 3.8 - 10.8 Thousand/uL 5.4 RBC Latest Ref Range: 3.80 - 5.10 Million/uL 4.52 Hemoglobin Latest Ref Range: 11.7 - 15.5 g/dL 28.4 Hematocrit Latest Ref Range: 35.0 - 45.0 % 41.4 MCV Latest Ref Range: 80.0 - 100.0 fL 91.6 MCH Latest Ref Range: 27.0 - 33.0 pg 30.5 RDW-CV Latest Ref Range: 11.0 - 15.0 % 14.3 Platelets Latest Ref Range: 140 - 400 Thousand/uL 314 MPV Latest Ref Range: 7.5 - 12.5 fL 9.3 Neutrophils Latest Units: % 57.5 Lymphocytes Latest Units: % 31.0 Monocytes Latest Units: % 8.9 Eosinophils Latest Units: % 2.0 Basophils Latest Units: % 0.6 Neutrophils Absolute Latest Ref Range: 1,500 - 7,800 cells/uL 3,105 Lymphocytes Absolute Latest Ref Range: 850 - 3,900 cells/uL 1,674 Monocytes (Absolute) Latest Ref Range: 200 - 950 cells/uL 481 Eosinophils Absolute Latest Ref Range: 15 - 500 cells/uL 108 Basophils Absolute Latest Ref Range: 0 - 200 cells/uL 32 MCHC Latest Ref Range: 32.0 - 36.0 g/dL 13.2 Sed Rate By Modified Westergren Latest Ref Range: < OR = 30 mm/h 6

## 2020-02-27 NOTE — Telephone Encounter
RNLVMTCB.

## 2020-03-07 ENCOUNTER — Encounter: Admit: 2020-03-07 | Payer: PRIVATE HEALTH INSURANCE

## 2020-03-07 DIAGNOSIS — M797 Fibromyalgia: Secondary | ICD-10-CM

## 2020-03-07 DIAGNOSIS — E559 Vitamin D deficiency, unspecified: Secondary | ICD-10-CM

## 2020-03-07 DIAGNOSIS — M542 Cervicalgia: Secondary | ICD-10-CM

## 2020-03-07 DIAGNOSIS — Z791 Long term (current) use of non-steroidal anti-inflammatories (NSAID): Secondary | ICD-10-CM

## 2020-03-07 DIAGNOSIS — M545 Low back pain, unspecified back pain laterality, unspecified chronicity, unspecified whether sciatica present: Secondary | ICD-10-CM

## 2020-03-07 DIAGNOSIS — M8589 Other specified disorders of bone density and structure, multiple sites: Secondary | ICD-10-CM

## 2020-03-07 DIAGNOSIS — R7982 Elevated C-reactive protein (CRP): Secondary | ICD-10-CM

## 2020-03-13 LAB — SEDIMENTATION RATE (ESR): SED RATE BY MODIFIED WESTERGREN: 9 mm/h (ref <=30)

## 2020-03-13 LAB — C-REACTIVE PROTEIN     (CRP): C-REACTIVE PROTEIN: 11.7 mg/L — ABNORMAL HIGH (ref <8.0)

## 2020-03-13 LAB — VITAMIN D, 25-HYDROXY: VITAMIN D, 25 OH, TOTAL: 31 ng/mL (ref 30–100)

## 2020-03-15 ENCOUNTER — Encounter: Admit: 2020-03-15 | Payer: PRIVATE HEALTH INSURANCE | Attending: Rheumatology

## 2020-03-15 MED ORDER — VENLAFAXINE ER 37.5 MG CAPSULE,EXTENDED RELEASE 24 HR
37.5 mg | ORAL_CAPSULE | Freq: Every day | ORAL | 6 refills | Status: AC
Start: 2020-03-15 — End: 2020-09-05

## 2020-03-15 NOTE — Telephone Encounter
Last visit 1-5-2021Next visit 7-20-2021Last labs:Lab Results Component Value Date  WBC 5.8 03/12/2020  HGB 13.9 03/12/2020  HCT 42.0 03/12/2020  MCV 93.5 03/12/2020  MCH 31.0 03/12/2020  MCHC 33.1 03/12/2020  PLT 312 03/12/2020  MPV 9.5 03/12/2020  NEUTROPHILS 59.6 03/12/2020  LYMPHOCYTES 30.7 03/12/2020  MONOCYTES 7.5 03/12/2020  EOSINOPHILS 1.7 03/12/2020  Lab Results Component Value Date  NA 139 03/12/2020  K 4.4 03/12/2020  CL 105 03/12/2020  CO2 26 03/12/2020  GLU 92 03/12/2020  BUN 18 03/12/2020  CREATININE 0.79 03/12/2020  EGFR >60 02/14/2013  EGFRAFRAMER 97 03/12/2020  CALCIUM 9.3 03/12/2020  ALBUMIN 4.5 03/12/2020  PROT 7.4 02/14/2013  BILITOT 0.6 03/12/2020  ALKPHOS 52 03/12/2020  ALT 30 (H) 03/12/2020  AST 21 03/12/2020  GLOB 2.6 03/12/2020  Lab Results Component Value Date  CRPQ 11.7 (H) 03/12/2020  SEDRATE 9 03/12/2020

## 2020-03-18 ENCOUNTER — Inpatient Hospital Stay: Admit: 2020-03-18 | Discharge: 2020-03-18 | Payer: PRIVATE HEALTH INSURANCE

## 2020-03-18 DIAGNOSIS — Z1509 Genetic susceptibility to other malignant neoplasm: Secondary | ICD-10-CM

## 2020-03-22 ENCOUNTER — Encounter: Admit: 2020-03-22 | Payer: PRIVATE HEALTH INSURANCE | Attending: Rheumatology

## 2020-03-22 DIAGNOSIS — M545 Low back pain, unspecified back pain laterality, unspecified chronicity, unspecified whether sciatica present: Secondary | ICD-10-CM

## 2020-03-22 DIAGNOSIS — M47812 Spondylosis without myelopathy or radiculopathy, cervical region: Secondary | ICD-10-CM

## 2020-03-22 DIAGNOSIS — M25551 Pain in right hip: Secondary | ICD-10-CM

## 2020-03-22 DIAGNOSIS — M65329 Trigger finger, unspecified index finger: Secondary | ICD-10-CM

## 2020-03-22 DIAGNOSIS — M8589 Other specified disorders of bone density and structure, multiple sites: Secondary | ICD-10-CM

## 2020-03-22 DIAGNOSIS — M542 Cervicalgia: Secondary | ICD-10-CM

## 2020-03-22 DIAGNOSIS — M47816 Spondylosis without myelopathy or radiculopathy, lumbar region: Secondary | ICD-10-CM

## 2020-03-22 DIAGNOSIS — M797 Fibromyalgia: Secondary | ICD-10-CM

## 2020-03-22 DIAGNOSIS — G5600 Carpal tunnel syndrome, unspecified upper limb: Secondary | ICD-10-CM

## 2020-03-22 DIAGNOSIS — E559 Vitamin D deficiency, unspecified: Secondary | ICD-10-CM

## 2020-03-22 DIAGNOSIS — R7982 Elevated C-reactive protein (CRP): Secondary | ICD-10-CM

## 2020-03-22 DIAGNOSIS — G5621 Lesion of ulnar nerve, right upper limb: Secondary | ICD-10-CM

## 2020-03-22 DIAGNOSIS — Z791 Long term (current) use of non-steroidal anti-inflammatories (NSAID): Secondary | ICD-10-CM

## 2020-03-25 ENCOUNTER — Ambulatory Visit: Admit: 2020-03-25 | Payer: PRIVATE HEALTH INSURANCE | Attending: Surgery

## 2020-03-25 ENCOUNTER — Encounter: Admit: 2020-03-25 | Payer: PRIVATE HEALTH INSURANCE | Attending: Surgery

## 2020-03-25 DIAGNOSIS — M1990 Unspecified osteoarthritis, unspecified site: Secondary | ICD-10-CM

## 2020-03-25 DIAGNOSIS — M722 Plantar fascial fibromatosis: Secondary | ICD-10-CM

## 2020-03-25 DIAGNOSIS — A6920 Lyme disease, unspecified: Secondary | ICD-10-CM

## 2020-03-25 DIAGNOSIS — D3610 Benign neoplasm of peripheral nerves and autonomic nervous system, unspecified: Secondary | ICD-10-CM

## 2020-03-25 DIAGNOSIS — Q825 Congenital non-neoplastic nevus: Secondary | ICD-10-CM

## 2020-03-25 DIAGNOSIS — L821 Other seborrheic keratosis: Secondary | ICD-10-CM

## 2020-03-25 DIAGNOSIS — I889 Nonspecific lymphadenitis, unspecified: Secondary | ICD-10-CM

## 2020-03-25 DIAGNOSIS — Z1509 Genetic susceptibility to other malignant neoplasm: Secondary | ICD-10-CM

## 2020-03-26 ENCOUNTER — Encounter: Admit: 2020-03-26 | Payer: PRIVATE HEALTH INSURANCE | Attending: Rheumatology

## 2020-03-26 DIAGNOSIS — Q825 Congenital non-neoplastic nevus: Secondary | ICD-10-CM

## 2020-03-26 DIAGNOSIS — M545 Low back pain, unspecified back pain laterality, unspecified chronicity, unspecified whether sciatica present: Secondary | ICD-10-CM

## 2020-03-26 DIAGNOSIS — M722 Plantar fascial fibromatosis: Secondary | ICD-10-CM

## 2020-03-26 DIAGNOSIS — M1990 Unspecified osteoarthritis, unspecified site: Secondary | ICD-10-CM

## 2020-03-26 DIAGNOSIS — R7982 Elevated C-reactive protein (CRP): Secondary | ICD-10-CM

## 2020-03-26 DIAGNOSIS — M797 Fibromyalgia: Secondary | ICD-10-CM

## 2020-03-26 DIAGNOSIS — Z791 Long term (current) use of non-steroidal anti-inflammatories (NSAID): Secondary | ICD-10-CM

## 2020-03-26 DIAGNOSIS — M6530 Trigger finger, unspecified finger: Secondary | ICD-10-CM

## 2020-03-26 DIAGNOSIS — I889 Nonspecific lymphadenitis, unspecified: Secondary | ICD-10-CM

## 2020-03-26 DIAGNOSIS — M47812 Spondylosis without myelopathy or radiculopathy, cervical region: Secondary | ICD-10-CM

## 2020-03-26 DIAGNOSIS — M542 Cervicalgia: Secondary | ICD-10-CM

## 2020-03-26 DIAGNOSIS — M5416 Radiculopathy, lumbar region: Secondary | ICD-10-CM

## 2020-03-26 DIAGNOSIS — A6920 Lyme disease, unspecified: Secondary | ICD-10-CM

## 2020-03-26 DIAGNOSIS — M8589 Other specified disorders of bone density and structure, multiple sites: Secondary | ICD-10-CM

## 2020-03-26 DIAGNOSIS — M65329 Trigger finger, unspecified index finger: Secondary | ICD-10-CM

## 2020-03-26 DIAGNOSIS — D3610 Benign neoplasm of peripheral nerves and autonomic nervous system, unspecified: Secondary | ICD-10-CM

## 2020-03-26 DIAGNOSIS — L821 Other seborrheic keratosis: Secondary | ICD-10-CM

## 2020-03-26 DIAGNOSIS — E559 Vitamin D deficiency, unspecified: Secondary | ICD-10-CM

## 2020-03-26 NOTE — Patient Instructions
Begin 2,000 IU over the counter Vit D once daily -try to get gelcaps Call Dr    Lurlean Leyden about your right index  finger Labs before next visit

## 2020-03-26 NOTE — Progress Notes
Face to face     History of Present Illness  Laurie White is a 59 y.o. female with Fibromyalgia , chronic joint pain in the setting of a normal ESR CRP and negative serologies, negative bone scan who presents for a follow up visit     Last seen 09/28/19 for telehealth       History   [name] presented  On 10/31/2012 as  a 59 year old with a strong family history of Inflammatory arthritis in her mother and sister Laurie White) who was well until her early 49s when she developed initial symptoms of numbness in her hands and pain in her fingers. She presented for a Rheumatology consultation for joint pain. 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 She notes she saw a specialist several years ago who she thinks was a neurologist. She had nerve conduction studies which apparently showed carpal tunnel syndrome of unknown severity. She was told to wear wrist splints.      She  had stiffness in her fingers and her hands and knuckles were sore. She also noted pain in her DIP joints.  She saw a Rheumatologist Laurie Fleeting MD, who gave her Mobic  and  followed the patient her for 3 years. She notes Laurie Meuse MD for the last 2 years.  She notes she will not return to that office.    She has had stiffness in neck , and pain in her ankles , feet,   fingers , wrists, and hips.    She has the worst stiffness is for 10 minutes in the mornings, but she has stiffness all day.      She notes she was seen last by Laurie Meuse MD on 09/13/2012 and was given Prednisone 10 mg po QAM.  She notes she has gained 20 lbs  Over a year.    On  Prednisone. She noted marked improvement. She has less joint pain. She had concerns because she can hear  cracking sound in her neck. She notes persistent stiffness in her ankles  hips are still stiff and  sore.      She noted I am not a good pill taker.    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 She has improved since the injection and with the use of orthotics.  She was diagnosed with Fibromyalgia.  She had a bone scan and labs .  She was taken off Prednisone and diagnosed with a Non inflammatory Polyarthralgias:   She had Very little to suggest inflammatory arthritis on labs , xrays and bone scan.  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 She  had negative serologies including an RF and CCP.  She was also diagnosed with Plantar fasciitis and was advised that she still may develop a  spondylarthropathy. She is HLA B 27 +  A bone scan  was neg for inflam arthritis.  She was diagnosed with Fibromyalgia  She failed Cymbalta and Neurontin and was transitioned to Effexor with good results.    Her last visit was 09/12/2019 via telehealth     Immunization History   Administered Date(s) Administered   ? COVID-19 Vaccine - PFIZER 12/26/2019, 01/16/2020       Interim History  She had carpal tunnel releases and trigger finger surgery with Laurie White since her last visit .  Fibromyalgia update  She is on Effexor 37.5 mg 3 daily form this office for Fibromyalgia which works   She notes her energy level is decent  She feels tired a lot.  She is sleeping 6 hours per night  She notes her husband told her she snores.  She has minimal sleep disturbance and restorative sleep  She has not had sleep study  She has less diffuse pain.   She has not missed any medication.        Carpal tunnel update  She had her left carpal tunnel release with Laurie Laurie Carnes MD 09/20/2019 and her right carpal tunnel  11/08/2019   She has no residual numbness       Trigger finger update  Left middle finger was done 09/20/19   She had right hand middle finger and index finger surgery 11/08/2019  She has residual pain in her right index finger and has not spoken to her surgeon about her hand pain yet.    Knee update  She had a left knee injury when her right leg broke through her attic ceiling in 2019.  She has stiffness but no pain.     Hypothyroidism update  She is on Levothyroxine and she is due in August for a physical .        Vitamin D update   She had vitamin D high dose repletion  She is not on any form of Vitamin D supplement   Her vit D level in July was 31        Spine update   She is on Neurontin 900mg  for her  right sided radicular lower back pain.   She rates her neck pain 7/10 especially with driving and turning her head.   She rates her lower back 8/10  She is ready to consult a spine surgeon.  She plans to see her sister's surgeon       Elbow update   She has had right elbow pain with  ulnar entrapment. who sent her for OT 12 times. She is better.   She saw surgeon Laurie Nickel MD  a hand surgeon , an associate in  Laurie Carnes MD office. It is WORK RELATED.  She notes the pain is no longer daily and she did nt return to see Laurie Laurie White.         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    She has better shoes        Fall History   She fell down her back porch steps in April 2019.   She has not had any further falls     Urology update   She had a negative MRI of abdomen  in   September 20  She had a negative  renal US March 18 2020  She saw   Laurie Laurie White , urologist March 25 2020  She will see him annually following a Laurie White scan in July 2022  Her children are negative for the GENE.      Family history   She has a strong family history of Inflammatory arthritis in her mother and sister Laurie White)    She has a strong family history of Inflammatory arthritis in her mother and sister Laurie White)    She lost her mother in June  2016  from Renal Cancer.  Social History   Married to Laurie White. He will be retired soon.  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   Daughter Laurie White  23 ( February)  She graduated is finishing up her masters and will begin an internship   She is pursing an MSW    She is in her own apartment.      Son  Laurie White  July 24  ( July 23 )  is in Counselling psychologist at [employer] in [place].       Medication History  She has failed a number of medications including  Savella, Ultram, Neurontin, Cymbalta, etodolac,   Lodine, Mobic and  could not afford Celebrex.      HealthCare Team   PCP Thomasenia Bottoms MD  Urology Elie Goody MD  Laurie Elbow -Billey Gosling MD.  Podiatry Laurie White DPM    Current Outpatient Medications   Medication Sig   ? gabapentin Take 1 capsule (100 mg total) by mouth nightly.   ? gabapentin Take 2 capsules (800 mg total) by mouth nightly.   ? levothyroxine 50 mcg daily..   ? venlafaxine XR Take 3 capsules (112.5 mg total) by mouth daily.   ? ergocalciferol Take 1 capsule (50,000 Units total) by mouth once a week. (Patient not taking: Reported on 03/26/2020)     No current facility-administered medications for this visit.        Past Medical History:   Diagnosis Date   ? Birth mark      lips left breast , right side of neck   ? Lyme disease 2009    Lyme in child hood and in 2009   ? Lymphadenitis 06/2010    Right side of neck , 06/2010 - Keflex and Septra   ? Neuroma     foot?   ? 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 Plantar fasciitis     injection left heel - Laurie Haggis DPM   ? Seborrheic keratosis    ? Trigger finger      Past Surgical History:   Procedure Laterality Date   ? BUNIONECTOMY Left 1989   ? CARPAL TUNNEL RELEASE Left 09/20/2019   ? CARPAL TUNNEL RELEASE Right 11/08/2019   ? CESAREAN SECTION  96, 98   ? FOOT SURGERY      numerous   ? MYOMECTOMY  12/1992   ? TOE SURGERY Bilateral     Toe surgeries in 1980s both feet   ? TONSILLECTOMY  55    age 52   ? TOTAL ABDOMINAL HYSTERECTOMY  2000 one ovary remains 2000 due to Fibroids   ? TRIGGER FINGER RELEASE Left 09/20/2019    at time of CTR, left middle finger release   ? TRIGGER FINGER RELEASE Right 11/08/2019    Rigt index , and middle finger   ? UTERINE FIBROID SURGERY  1984    Fibroid  uterus 1984 removed large Fibroid       No Known Allergies  Family History   Problem Relation Age of Onset   ? Kidney cancer Mother    ? Spondyloarthropathy Mother    ? Irritable bowel syndrome Mother         Colitis   ? Rheum arthritis Sister    ?  Rheum arthritis Paternal Aunt    ? Osteoarthritis Paternal Aunt    ? Osteoporosis Paternal Aunt        Review of Systems (negative except as noted; positive findings bold)  Constitutional:   weight changes,   weight loss ,   weight gain  Current  227 up from 225 Lbs  fatigue,        Eye : Eye pain ,   Eye Redness    Dry eye    Loss of vision  Double vision    ENT : Ringing in the ears,   Hearing loss  Nose bleeds,        Cardiac : Chest pain   Irregular heart beat,   palpitations      Resp: Shortness of breath,   Difficulty breathing at night  Cough  Wheezing    GI : Nausea,   Vomiting ,   Vomiting of blood or coffee grounds ,    Abdominal pain     Genitourinary:  Difficult urination, Pain or burning on urination  Blood in urine,   Cloudy/smoky urine  Vaginal discharge      Musculoskeletal   Morning stiffness   Joint pain-  right shoulder, elbows,  middle finger   Joint swelling    Muscle weakness   muscle tenderness   muscle spasm  Neck pain  Lower back pain    Skin  Easy bruising  Rash  Psoriasis  Hives  Skin tightness  Hair loss       Raynaud's   Color changes of hands and feet in the cold    Neurology  Headaches,   Dizziness  Problems with memory   Loss of balance  Numbness  Tingling   Problems with thinking   RLS ( restless legs)     Psychiatric  Anxiety, Depression  Problems with social activities     Sleep   Difficulty falling asleep,   Difficulty staying asleep    Endocrine  Excessive thirst       BP 131/78  - Pulse 85  - Temp 97.8 ?F (36.6 ?C) (Temporal)  - Wt 103 kg  - SpO2 95%  - BMI 33.52 kg/m?      limited physical exam was performed due to video visit.     This 59 year old female presents for Follow Up of Fibromyalgia Syndrome.    History of Present Illness    HPI DETAILS:   [name] is a  59 year old white female with Fibromyalgia , chronic joint pain in the setting of a normal ESR CRP and negative serologies, negative bone scan who presents for a follow up visit.     Since her last visit , she had a left knee injury when her right leg broke through her attic ceiling.  She saw Laurie Michael Boston Laurie  who did an MRI and it was normal.    She is on Effexor 37.5 mg 3 daily  for Fibromyalgia which works   well.      She was diagnosed with hypothyroidism and  continues  on Levothyroxine        Previously noted that on  Vitamin D supplement makes her feel better.   She is due for a level now and has missed a few doses.    She is on Neurontin 900mg  for her  right sided radicular lower back pain. It is helping her lower back   She has tolerable lower back pain.  She has had right elbow pain with  ulnar entrapment. who sent her for OT 12 times. She is better.   She saw surgeon Laurie Nickel MD  a hand surgeon , an associate in  Laurie Carnes MD office. It is WORK RELATED.     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 She  continues to  use of orthotics.       She notes left knee is overall  better   Her left middle finger is  intermittently   locking   Occasional elbow pain.    She fell down her back porch steps in April 2019.    She has a hematoma on her right thigh She had an Ultrasound which confirmed a hematoma.  She has to have it aspirated 3 times .with the last episode was November 2019  She re injured her elbow in the fall.    She fractured her clavicle in the past  In childhood and re injured it in the fall.  It still bothers her but not enough to look into it.   Both elbows both her.    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  It is getting worse.     She had a negative MRI of abdomen  in   September 2017 and  saw Laurie Janalyn Shy in late  September  2018   for screening for renal cancer  She did not go last year but she will go this year.  She will have a [place] scan this year.  Laurie Laurie White her new urologist has ordered it and will see her March 20 2019.     Her nephew Francee Piccolo tested positive for the Gene  Her children are negative for the GENE.        She has failed a number of medications including  Savella, Ultram, Neurontin, Cymbalta, etodolac,   Lodine, Mobic and  could not afford Celebrex.      She lost her mother in June  2016  from Renal Cancer.     Daughter Laurie White  21 ( February)   went to [place] and again to  [place]  at Spring Break with school.  She is   senior at [employer] in [place] in criminology.  She will  go to grad school at [employer] in September 2020 for MSW  She   is doing well with her Anxiety      Son  Laurie White  23  ( July 23 )  is in Counselling psychologist. Graduated from  [employer] and is an Tax inspector in  [place].   She notes he is in [place]  and loves his Job at [employer].      Past Medical History:   Diagnosis Date   ? Birth mark      lips left breast , right side of neck   ? Lyme disease 2009    Lyme in child hood and in 2009   ? Lymphadenitis 06/2010    Right side of neck , 06/2010 - Keflex and Septra   ? Neuroma     foot?   ? 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 Plantar fasciitis  injection left heel - Laurie Haggis DPM   ? Seborrheic keratosis    ? Trigger finger      Past Surgical History:   Procedure Laterality Date   ? BUNIONECTOMY Left 1989   ? CARPAL TUNNEL RELEASE Left 09/20/2019   ? CARPAL TUNNEL RELEASE Right 11/08/2019   ? CESAREAN SECTION  96, 98   ? FOOT SURGERY      numerous   ? MYOMECTOMY  12/1992   ? TOE SURGERY Bilateral     Toe surgeries in 1980s both feet   ? TONSILLECTOMY  4    age 41   ? TOTAL ABDOMINAL HYSTERECTOMY  2000    one ovary remains 2000 due to Fibroids   ? TRIGGER FINGER RELEASE Left 09/20/2019    at time of CTR, left middle finger release   ? TRIGGER FINGER RELEASE Right 11/08/2019    Rigt index , and middle finger   ? UTERINE FIBROID SURGERY  1984    Fibroid  uterus 1984 removed large Fibroid     Current Outpatient Medications   Medication Sig Dispense Refill   ? gabapentin (NEURONTIN) 100 mg capsule Take 1 capsule (100 mg total) by mouth nightly. 30 capsule 5   ? gabapentin (NEURONTIN) 400 mg capsule Take 2 capsules (800 mg total) by mouth nightly. 60 capsule 5   ? levothyroxine (SYNTHROID, LEVOTHROID) 50 MCG tablet 50 mcg daily.Marland Kitchen  0   ? venlafaxine XR (EFFEXOR XR) 37.5 mg 24 hr capsule Take 3 capsules (112.5 mg total) by mouth daily. 90 capsule 5   ? ergocalciferol (VITAMIN D2) 1,250 mcg (50,000 unit) capsule Take 1 capsule (50,000 Units total) by mouth once a week. (Patient not taking: Reported on 03/26/2020) 12 capsule 0     No current facility-administered medications for this visit.      No Known Allergies  Family History   Problem Relation Age of Onset   ? Kidney cancer Mother    ? Spondyloarthropathy Mother    ? Irritable bowel syndrome Mother         Colitis   ? Rheum arthritis Sister    ? Rheum arthritis Paternal Aunt    ? Osteoarthritis Paternal Aunt    ? Osteoporosis Paternal Aunt        Review of Systems (negative except as noted; positive findings bold)  Constitutional:   weight changes,   weight loss ,   weight gain  fatigue,   malaise,   fever,   chills,   sweats   Weakness  Feeling sickly  Swollen glands    Eye : Eye pain ,   Eye Redness    Dry eye    Loss of vision  Double vision    ENT : Ringing in the ears,   Hearing loss  Nose bleeds,   Dry nose,    Dry mouth, Mouth sores  Loss of taste,   Hoarseness  Problems with smell  Lump in throat  Stuffy nose    Cardiac : Chest pain   Irregular heart beat,   palpitations   murmur  Fainting spells (syncope)  Swollen legs or feet     Resp: Shortness of breath,   Difficulty breathing at night  Cough  Wheezing    GI : Nausea,   Vomiting ,   Vomiting of blood or coffee grounds ,    Abdominal pain  Stomach pain  Constipation    Diarrhea,    Blood in stools ,    Black  stools  Heart burn  Loss of appetite  Trouble swallowing     Genitourinary:  Difficult urination, Pain or burning on urination  Blood in urine,   Cloudy/smoky urine  Vaginal discharge  Penile discharge    Miscarriages    Musculoskeletal   Morning stiffness   Joint pain- left hip  Joint swelling    Muscle weakness   muscle tenderness   muscle spasm  Neck pain  Lower back pain    Skin  Easy bruising  Rash  Psoriasis  Hives  Skin tightness  Hair loss       Raynaud's   Color changes of hands and feet in the cold    Neurology  Headaches,   Dizziness  Problems with memory   Loss of balance  Numbness  Tingling   Problems with thinking   RLS ( restless legs)     Psychiatric  Anxiety, Depression  Problems with social activities     Sleep   Difficulty falling asleep,   Difficulty staying asleep    Endocrine  Excessive thirst       BP 131/78  - Pulse 85  - Temp 97.8 ?F (36.6 ?C) (Temporal)  - Wt 103 kg  - SpO2 95%  - BMI 33.52 kg/m?         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 No clubbing or edema is present.    Large varicosities ( head and neck)            NEUROLOGIC: normoactive symmetric reflexes, no sensory deficits, Positive L>>>R Tinel's test.     MUSCULOSKELETAL:Muscle tone and strength are normal.   No kyphosis is noted.       She had mild decrease in ROM of her  neck &  trap spasm, tenderness , decrease in ROM of  lower back.          On examination of her joints she had OA changes of her  DIPs,   trigger finger 3rd finger left hand   Normal PIPs & Normal MCPs   She had full ROM of her wrists, elbows & shoulders.    tender subacromial bursa **   + Ulnar tinel's    She had full ROM of her hips.   Large Hematoma lateral to her right thigh-  13.6 x 9 x 11.cm        Knees had FROM and there was no evidence of an effusion; she had no instability; no  varus or valgus deformities.           he had no acute synovitis of any joints.           She had paired positive paired tender points .      Labs were reviewed with the patient today  Lab Results   Component Value Date    WBC 5.8 03/12/2020    HGB 13.9 03/12/2020    HCT 42.0 03/12/2020    MCV 93.5 03/12/2020    MCH 31.0 03/12/2020  MCHC 33.1 03/12/2020    PLT 312 03/12/2020    MPV 9.5 03/12/2020    NEUTROPHILS 59.6 03/12/2020    LYMPHOCYTES 30.7 03/12/2020    MONOCYTES 7.5 03/12/2020    EOSINOPHILS 1.7 03/12/2020     SED rate   6    09/05/2019     Lab Results   Component Value Date    NA 139 03/12/2020    K 4.4 03/12/2020    CL 105 03/12/2020    CO2 26 03/12/2020    GLU 92 03/12/2020    BUN 18 03/12/2020    CREATININE 0.79 03/12/2020    EGFR >60 02/14/2013    EGFRAFRAMER 97 03/12/2020    CALCIUM 9.3 03/12/2020    ALBUMIN 4.5 03/12/2020    PROT 7.4 02/14/2013    BILITOT 0.6 03/12/2020    ALKPHOS 52 03/12/2020    ALT 30 (H) 03/12/2020    AST 21 03/12/2020    GLOB 2.6 03/12/2020               Imaging results    03/06/19 XR Hips b/l AP lateral   Normal Hips     03/06/2019   XR Lumbar Spine AP/ Lateral   DDD  There is mild L3-4, moderate L4-5, and moderately severe L5-S1 degenerative disc disease with facet arthropathy in the lower lumbar spine.    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   Impresson: No evidence of renal mass, small left inguinal hernia       Bone Density: Exam date 10/18/2018    Region BMD    (g/cm2) T-score   Z-score   Classification   AP Spine (L1-L4) 1.233  1.7  2.8 Normal   Femoral Neck (Left) 0.943  0.8  1.9 Normal   Total Hip (Left) 1.109  1.4  2.1 Normal        10-year Fracture Risk?:   Major Osteoporotic Fracture 4.6%   Hip Fracture < 0.1%     07/15/2016   BMD Right femur t score -0.7   L1-4  t score 1.7  Left femur  -0.1    Old data     06/29/2016   Lyme IgG WB  positive        11/26/2015    Urine Cx Calcium Oxalate     BMD was normal in 04/13/2013     12/16/2012  Bone scan Mild uptake Right AC joint    11/01/2012 C reactive protein neg at 0.28  Low Vit D level at 25.5   Vit B 12 level 434  ESR 7   CBC normal    ANA neg  RF neg at 6.20, CCP neg   SPEP neg  Parvovirus IgM , IgG neg    Hep screen neg    ACE 44 ( normal)     HLA  B 27 +      Assessment/Plan    Fibromyalgia.  Will continue Effexor  37.5mg   3 po QD &  Neurontin 800mg  3 hours before  for neck pain.  She was advised to continue her medication prescribed by this office.  Failed Cymbalta,  Ultram as above.     She is doing well and will continue current treatment     LBP   Core exercises given previously   Pt had XRS that showed moderately severe DDD at L2-S2, with severe changes at L5-S1. She was encouraged to see a spine surgeon  now.  She plans to see Laurie Jackquline Bosch who I suggested today. I told her it is time to schedule an appointment.      SHOULDER PAIN  Recommend x ray as bone scan showed uptake in her shoulder AC joint  Since her fall has had more pain.  Declines injections or x rays    Non inflammatory Polyarthralgias:   No findings to  suggest inflammatory arthritis on labs , x-rays and bone scan.   She is HLA B 27 +   A bone scan  was previously  neg for inflam arthritis.      KNEE PAIN  Resolved.    LFT TRIGGER 3rd FINGER  S/p release     Carpal and Cubital  tunnel .    Surgery went well with Laurie White.    RLS  Plan is to continue  Neurontin as above.  She will split her dose in the evenings      Bone pain:   BMD was normal  10/2018      HEMATOMA AFTER FALL 12/2017  Aspiration x 3 until 07/2018  [facility] 12/07/2017.    Health Maintenance   Colonoscopy Jan 29 2016  Had a CPE  04/2019       WEIGHT   She was advised to lose weight  Weight loss will help her back      69m    Zenaida Niece MD      Scribed for Elenor Quinones, MD MD by Kittie Plater, medical scribe. March 26, 2020  Electronically Signed by Kittie Plater, March 25, 2020      The documentation recorded by the scribe accurately reflects the services I personally performed and the decisions made by me. I reviewed and confirmed all material entered and/or pre-charted by the scribe.    Elenor Quinones, MD    Electronically Signed by Elenor Quinones, MD, March 26, 2020

## 2020-03-30 NOTE — Progress Notes
Chief Complaint:  The patient is a pleasant 59 y.o. female with HLRCC who arrives for further evaluation and management. History of Present Illness:  Laurie White is a 59 y.o. female who is aformer patient of Dr. Janann Colonel. 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 Her mother had metastatic HLRCC and passed away from [facility] in 2016/03/22. She is feeling well without issuesFifty throughout female with HLRCC with history of fibroids and cutaneous leiomyomas.  Her mother passed away from metastatic [facility].  Laurie White children were tested negative for a germ line mutation however her sister's son who was in college tested positive for a germ line mutation in [facility] is doing well without any issues.  She did see a dermatologist did have 1 of her skin lesions treated in the past year. She has been seen by Dr. Janann Colonel and subsequently Dr. Oneita Hurt. She has now transitionedher care to me.  The patient presents with no issues. [place] abdomen and pelvis with and without contrast on 03/06/2019 is negative for a renal mass, stone, or hydronephrosis.Had renal US on 03/18/2020 which showed no evidence of renal mass.  The patient is doing well and has no complaints. The patient has no gross hematuria, dysuria, or pain, and denies fevers, chills, nausea, emesis, or diarrhea.Medical History:Past Medical History: Diagnosis Date ? Birth mark    lips left breast , right side of neck ? 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 Lymphadenitis 06/2010  Right side of neck , 06/2010 - Keflex and Septra ? Neuroma   foot? ? 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 Plantar fasciitis   injection left heel - Barbie Haggis DPM ? Seborrheic keratosis  ? Trigger finger  Patient Active Problem List  Diagnosis Date Noted ? Elevated C-reactive protein 09/27/2018 ? Vitamin D deficiency, unspecified 09/27/2018 ? Trigger finger, left middle finger 09/27/2018 ? 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 Spondylosis of cervical region without myelopathy or radiculopathy 09/27/2018 ? Cervicalgia 09/27/2018 ? Low back pain, unspecified back pain laterality, unspecified chronicity, unspecified whether sciatica present 09/27/2018 ? Long term (current) use of non-steroidal anti-inflammatories (nsaid) 09/27/2018 ? Lesion of ulnar nerve, right upper limb 09/27/2018 ? Hyperlipidemia, unspecified hyperlipidemia type 09/27/2018 ? Carpal tunnel syndrome, unspecified laterality 09/27/2018 ? Fibromyalgia 09/27/2018 ? 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 BUNIONECTOMY Left 1989 ? CARPAL TUNNEL RELEASE Left 09/20/2019 ? CARPAL TUNNEL RELEASE Right 11/08/2019 ? CESAREAN SECTION  96, 98 ? FOOT SURGERY    numerous ? MYOMECTOMY  12/1992 ? TOE SURGERY Bilateral   Toe surgeries in 1980s both feet ? TONSILLECTOMY  3  age 59 ? TOTAL ABDOMINAL HYSTERECTOMY  2000  one ovary remains 2000 due to Fibroids ? TRIGGER FINGER RELEASE Left 09/20/2019  at time of [facility], left middle finger release ? TRIGGER FINGER RELEASE Right 11/08/2019  Rigt index , and middle finger ? UTERINE FIBROID SURGERY  03-23-1983  Fibroid  uterus March 23, 1983 removed large Fibroid Family History:Family History Problem Relation Age of Onset ? Kidney cancer Mother  ? Spondyloarthropathy Mother  ? Irritable bowel syndrome Mother       Colitis ? Rheum arthritis Sister  ? Rheum arthritis Paternal Aunt  ? Osteoarthritis Paternal Aunt  ? Osteoporosis Paternal Aunt  History reviewed. No pertinent family history of urologic malignancy.Social History:Social History Socioeconomic History ? Marital status: Married   Spouse name: Not on  file ? Number of children: Not on file ? Years of education: Not on file ? Highest education level: Not on file Tobacco Use ? Smoking status: Never Smoker ? Smokeless tobacco: Never Used Substance and Sexual Activity ? Alcohol use: Yes   Alcohol/week: 4.0 standard drinks   Types: 4 Glasses of wine per week ? Drug use: No  Medications:Current Outpatient Medications on File Prior to Visit Medication Sig Dispense Refill ? ergocalciferol (VITAMIN D2) 1,250 mcg (50,000 unit) capsule Take 1 capsule (50,000 Units total) by mouth once a week. (Patient not taking: Reported on 03/26/2020) 12 capsule 0 ? gabapentin (NEURONTIN) 100 mg capsule Take 1 capsule (100 mg total) by mouth nightly. 30 capsule 5 ? gabapentin (NEURONTIN) 400 mg capsule Take 2 capsules (800 mg total) by mouth nightly. 60 capsule 5 ? levothyroxine (SYNTHROID, LEVOTHROID) 50 MCG tablet 50 mcg daily.Marland Kitchen  0 ? venlafaxine XR (EFFEXOR XR) 37.5 mg 24 hr capsule Take 3 capsules (112.5 mg total) by mouth daily. 90 capsule 5 No current facility-administered medications on file prior to visit.  Allergies:No Known AllergiesReview of Systems: (ROS was reviewed since prior visit)Constitutional: Denies fatigue, unintentional weight lossEyes: Denies vision changesENT: Denies new problems with hearing or  swallowingCardiovascular: Denies new chest painRespiratory: Denies new shortness of breathGI: Denies new nausea, emesis, constipation, diarrheaGU: As in HPIMusculoskeletal: Denies new joint painIntegumentary: Denies new skin rash, lesionsNeurological: Denies new headache, seizuresPsychiatric: Denies new anxiety, psychosisEndocrine: Denies new sweating, thyroid problemsHematologic/Lymphatic: Denies new bleeding, anemiaAllergic/Immunologic: Denies new fever, chills, rigorsPhysical Exam:BP 131/83 (Site: r a, Position: Sitting, Cuff Size: Large)  - Pulse 77  - Temp 97.6 ?F (36.4 ?C)  - Ht 5' 9 (1.753 m)  - Wt 102.1 kg  - SpO2 98%  - BMI 33.23 kg/m? ECOG: 0PAIN ASSESSMENT: 0General Appearance: Alert, cooperative, no distress, well developed,			well nourished, appears stated ageHead: Normocephalic, without obvious abnormality, atraumaticEyes: Conjunctiva and lids within normal limitsEars, Nose, Mouth: External appearance within normal limits, hearing grossly intact Neck: Supple, normal range of motionRespiratory: Non laboredAbdomen: Soft, NT/ND, no guarding, no rebound, no palpable massesBack: No CVA tenderness, no spine tendernessSkin: Color, turgor normal, no visible rashes, lesionsMusculoskeletal: Intact sensation, normal range of motionExtremities: Extremities normal, no cyanosis or edemaPsychiatric: Alert/oriented x 3, affect within normal limits, mood within normal limitsNeurological: Gait stable for age, no focal defects. motor exam grossly intactImaging Studies: RENAL ULTRASOUND 03/18/2020?HISTORY: Hereditary leiomyomatosis and renal cell cancer syndrome?COMPARISON: [place], 02/14/2019?FINDINGS:?Right kidney measures 11.1 cm, left kidney measures 11.6 cm. ?Kidneys are noted to have lobulated contours which may be secondary to persistent fetal lobations. Evaluation for small focal masses is limited. No large masses are identified.?No hydronephrosis or renal calculi are identified.?Urinary bladder is minimally distended limiting evaluation.?Redemonstrated are gallstones in the gallbladder, not fully evaluated by this exam.?IMPRESSION:??No sonographic evidence for large renal mass. Small focal masses are not excluded and if of clinical concern, further evaluation with [place] should be considered.??[place] reviewed. There are no concerning lesionsCT abdomen and pelvis pre and post contrast?Clinical indications: History of hereditary leiomyomatosis and renal cell cancer syndrome?Comparison is made to a prior study of 2015. Noncontrast [place] sections were obtained through the kidneys. 100 cc of intravenous Omnipaque 350 was then given and sections obtained through the kidneys during the arterial phase of injection and from the dome of the diaphragm through the symphysis pubis during the portal venous phase. The liver, spleen, pancreas, adrenal glands and kidneys are normal. There is no renal mass or calculus. There is no retroperitoneal lymphadenopathy and no intraperitoneal or bowel  related mass. Stones are noted in the gallbladder.?Sections through the pelvis show no mass or lymphadenopathy. The patient is post hysterectomy. There is a small fat-containing inguinal hernia on the left and a 1.3 cm fairly dense well-defined round structure in the right inguinal canal of uncertain etiology but perhaps related to prior hysterectomy. As only the contrast portion examination was done in the pelvis, it is indeterminate whether this lesion is dense with calcium or there is contrast enhancement. This area was not covered on prior examinations. ?   Impression:??No evidence of renal mass?Reported And Signed By: Phoebe Sharps MDLaboratory: N/APathology: N/AAssessment and Plan: The patient is a 65F with a history of HLRCC undergoing continued kidney surveillance. We reviewed her diagnosis and management history to date including recent renal US showing no evidence of abnormality. Will continue surveillance consisting of kidney imaging. Wishes to avoid MRI due to claustrophobia. Will order [place] in 1 year.

## 2020-04-19 ENCOUNTER — Encounter: Admit: 2020-04-19 | Payer: PRIVATE HEALTH INSURANCE | Attending: Rheumatology

## 2020-04-19 DIAGNOSIS — M8589 Other specified disorders of bone density and structure, multiple sites: Secondary | ICD-10-CM

## 2020-04-19 DIAGNOSIS — M545 Low back pain, unspecified back pain laterality, unspecified chronicity, unspecified whether sciatica present: Secondary | ICD-10-CM

## 2020-04-19 DIAGNOSIS — G5600 Carpal tunnel syndrome, unspecified upper limb: Secondary | ICD-10-CM

## 2020-04-19 DIAGNOSIS — M47812 Spondylosis without myelopathy or radiculopathy, cervical region: Secondary | ICD-10-CM

## 2020-04-19 DIAGNOSIS — R7982 Elevated C-reactive protein (CRP): Secondary | ICD-10-CM

## 2020-04-19 DIAGNOSIS — E559 Vitamin D deficiency, unspecified: Secondary | ICD-10-CM

## 2020-04-19 DIAGNOSIS — Z791 Long term (current) use of non-steroidal anti-inflammatories (NSAID): Secondary | ICD-10-CM

## 2020-04-19 DIAGNOSIS — M47816 Spondylosis without myelopathy or radiculopathy, lumbar region: Secondary | ICD-10-CM

## 2020-04-19 DIAGNOSIS — M65329 Trigger finger, unspecified index finger: Secondary | ICD-10-CM

## 2020-04-19 DIAGNOSIS — G5621 Lesion of ulnar nerve, right upper limb: Secondary | ICD-10-CM

## 2020-04-19 DIAGNOSIS — M5416 Radiculopathy, lumbar region: Secondary | ICD-10-CM

## 2020-04-19 DIAGNOSIS — M797 Fibromyalgia: Secondary | ICD-10-CM

## 2020-04-19 DIAGNOSIS — M25551 Pain in right hip: Secondary | ICD-10-CM

## 2020-04-19 DIAGNOSIS — M542 Cervicalgia: Secondary | ICD-10-CM

## 2020-05-01 IMAGING — CR XR LUMBAR SPINE 2-3 VIEWS
3 series · 3 of 3 positions shown · non-contrast
Comparison: Plain films 3030

XR LUMBAR SPINE 2-3 VIEWS
INDICATION: Lumbago with sciatica, left side
Lumbago with sciatica, right side
Other chronic pain
TECHNIQUE: 3 views

[AP]
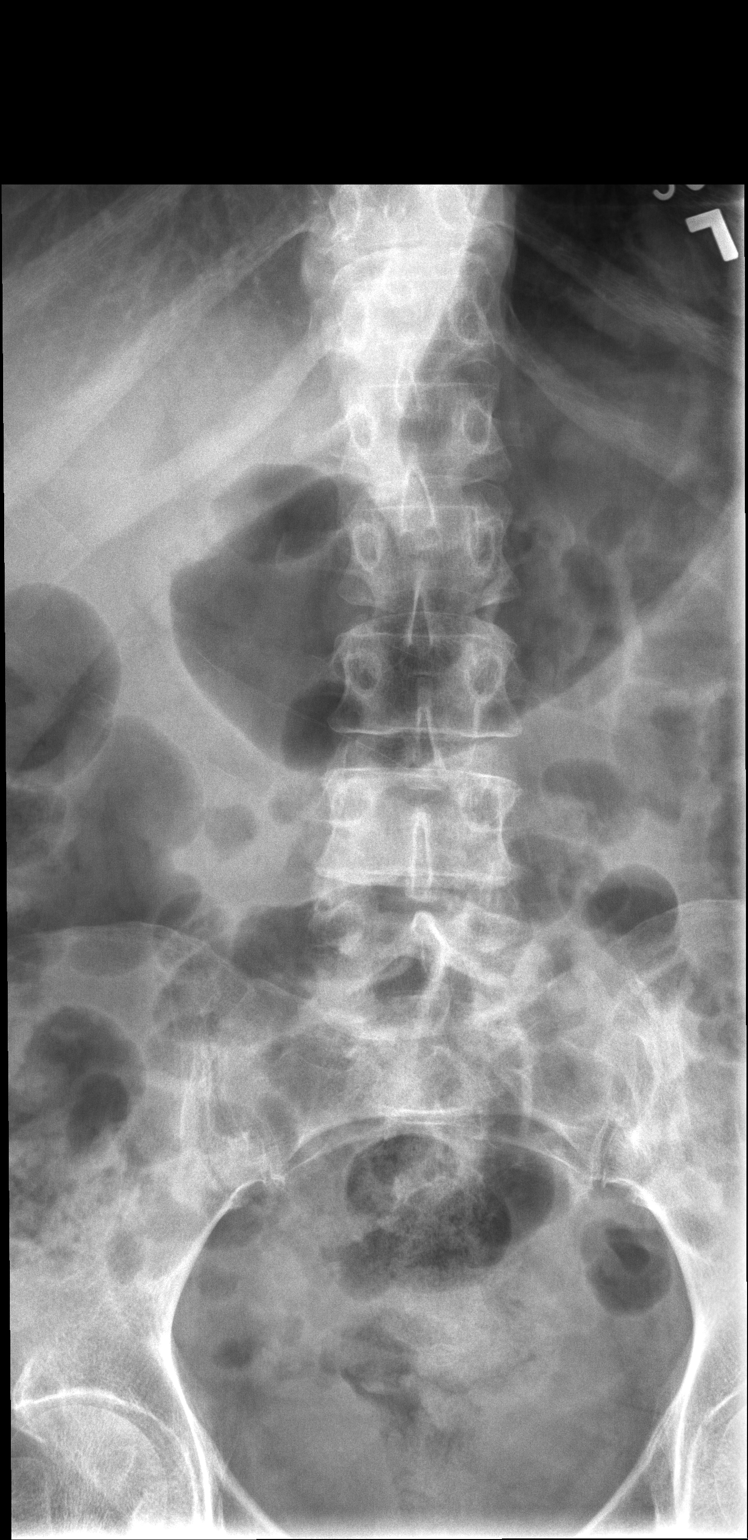

[lateral]
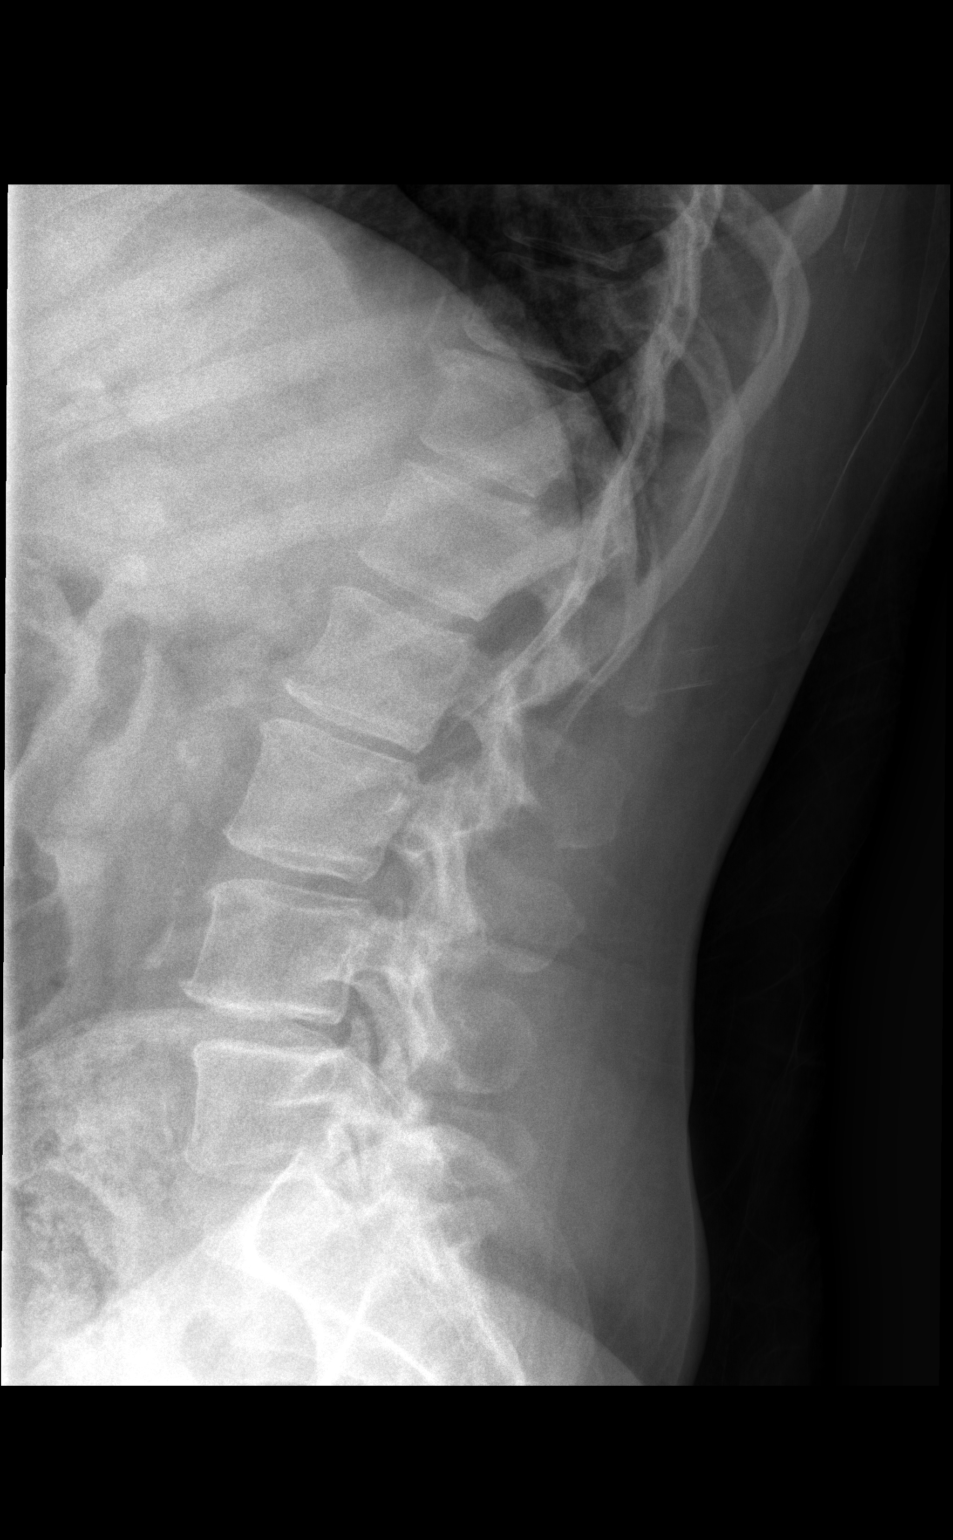

[l5-s1 spot]
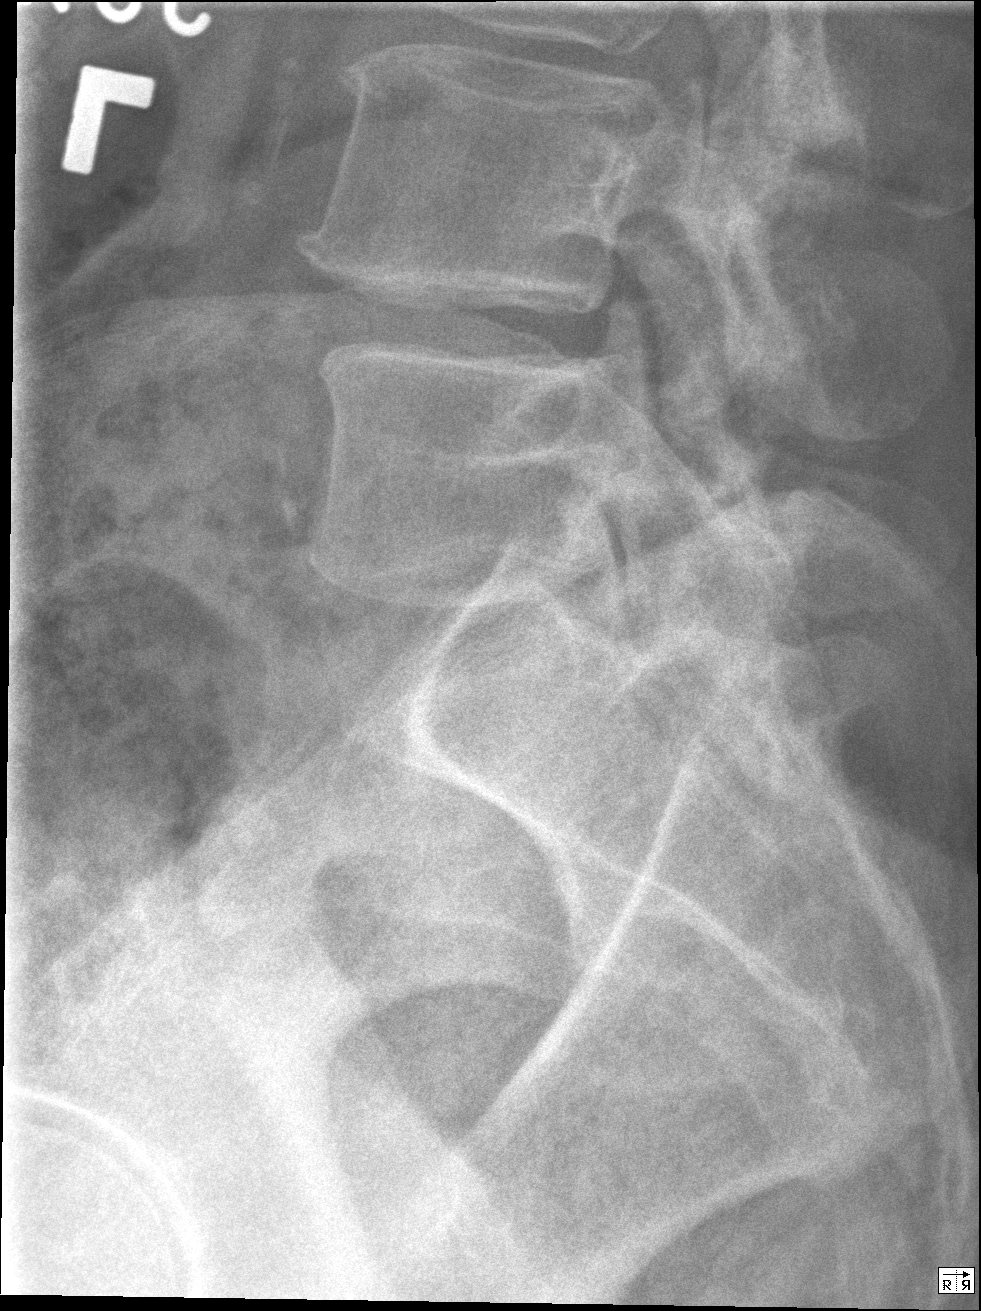

[3 of 3 positions shown; findings below may reference images not displayed]

FINDINGS: Lateral view demonstrates stable grade 1 anterior listhesis of L4 and L5 with disc space narrowing L5-S1 L3-4 L2-3 essentially stable and unchanged. No acute compression fracture. Stable anterior osteophytes T11-T12. AP view demonstrates a levocurvature stable and unchanged thoracolumbar spine
IMPRESSION: 
IMPRESSION: Stable degenerative changes without acute abnormality
Location: 1
Is the patient pregnant?
No

## 2020-05-01 IMAGING — CR XR CERVICAL SPINE COMPLETE 4-5 VIEWS
7 series · 7 of 7 positions shown · non-contrast
Comparison: None

XR CERVICAL SPINE COMPLETE 4-5 VIEWS
INDICATION: Pain in left arm
Cervicalgia
Other chronic pain
Technique 5 views

[left lateral (1 of 2)]
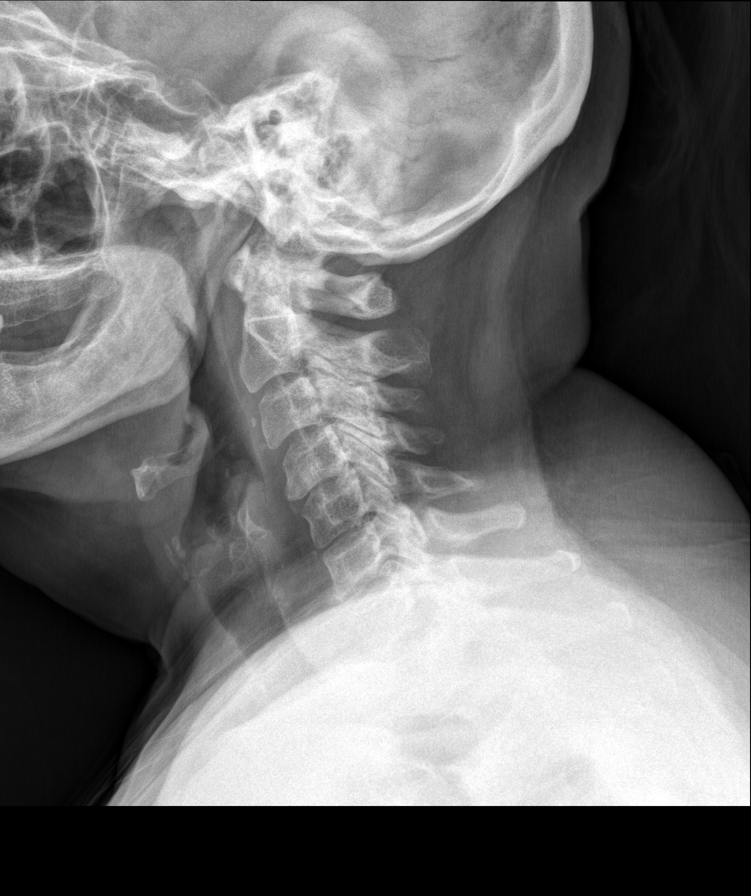

[left lateral (2 of 2)]
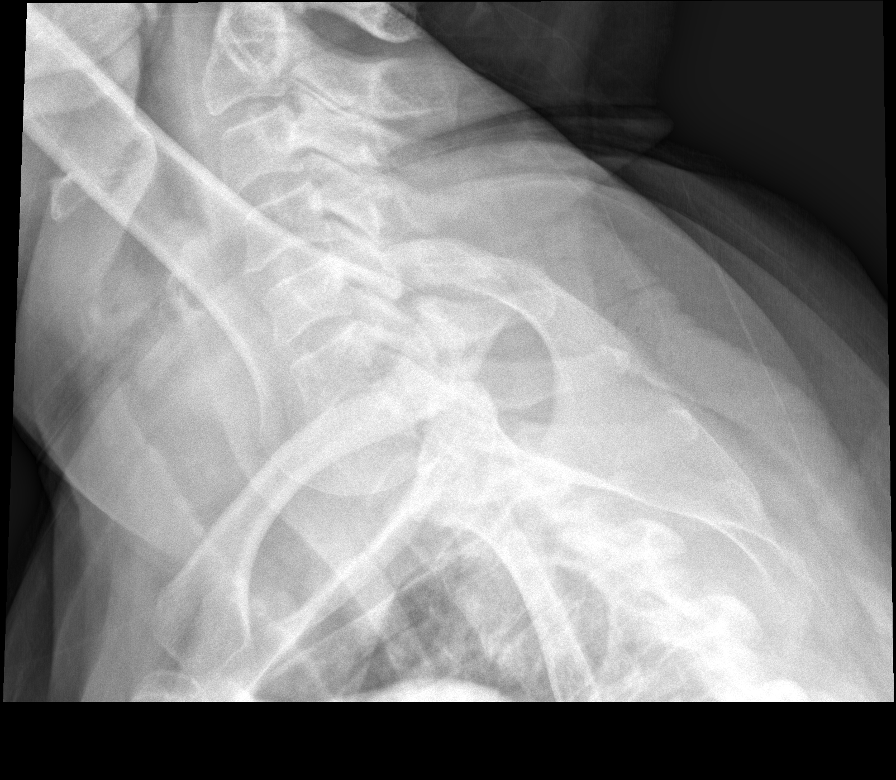

[AP (1 of 3)]
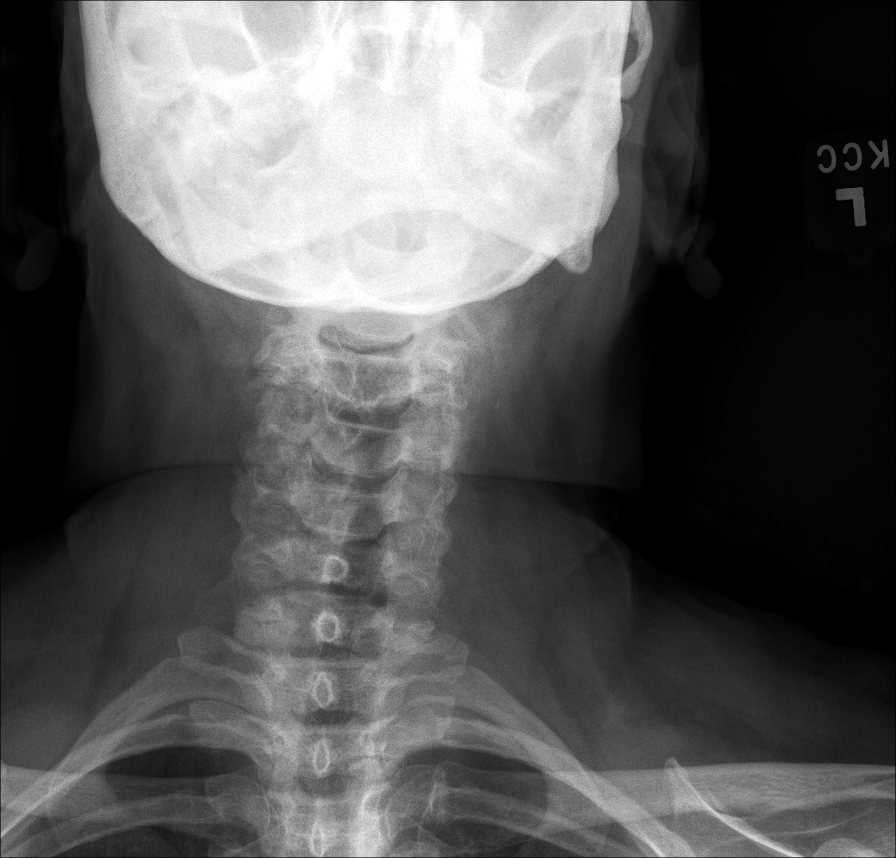

[rlo (1 of 2)]
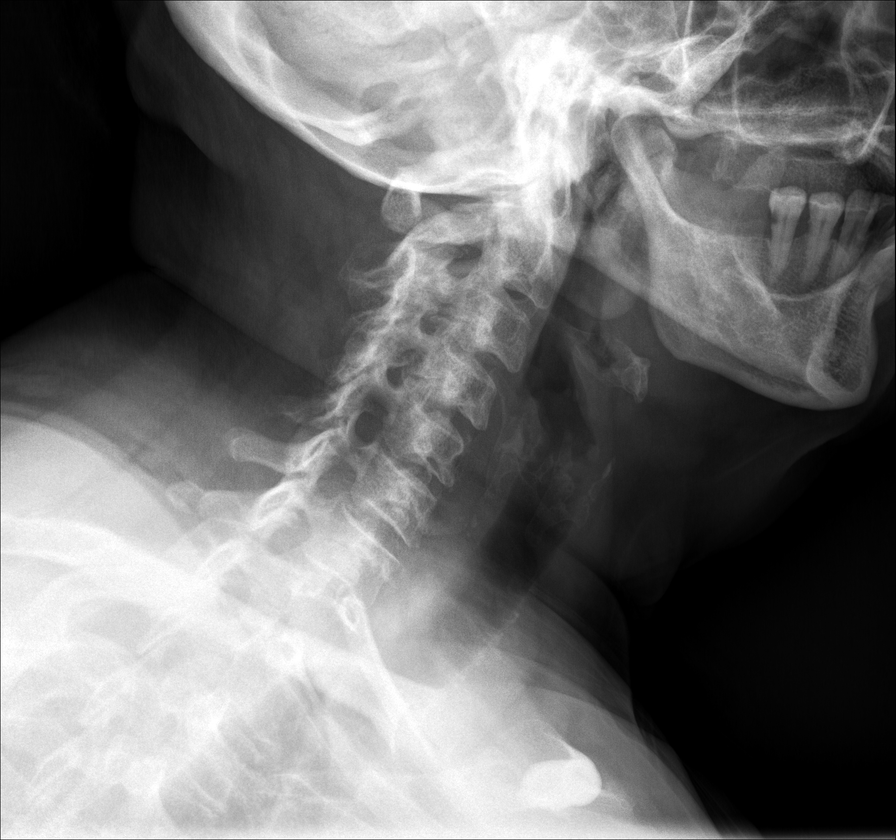

[rlo (2 of 2)]
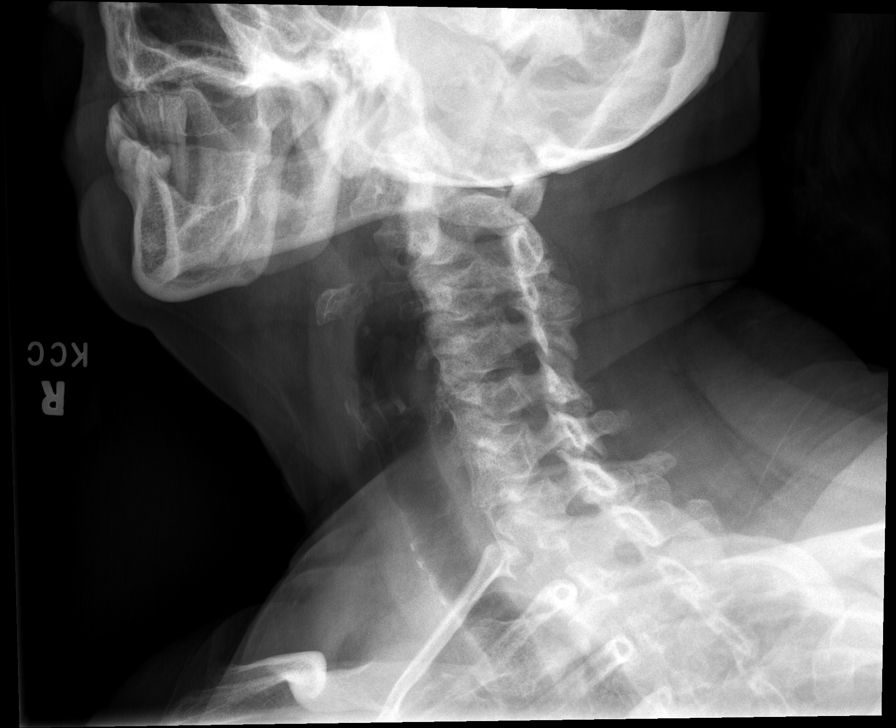

[AP (2 of 3)]
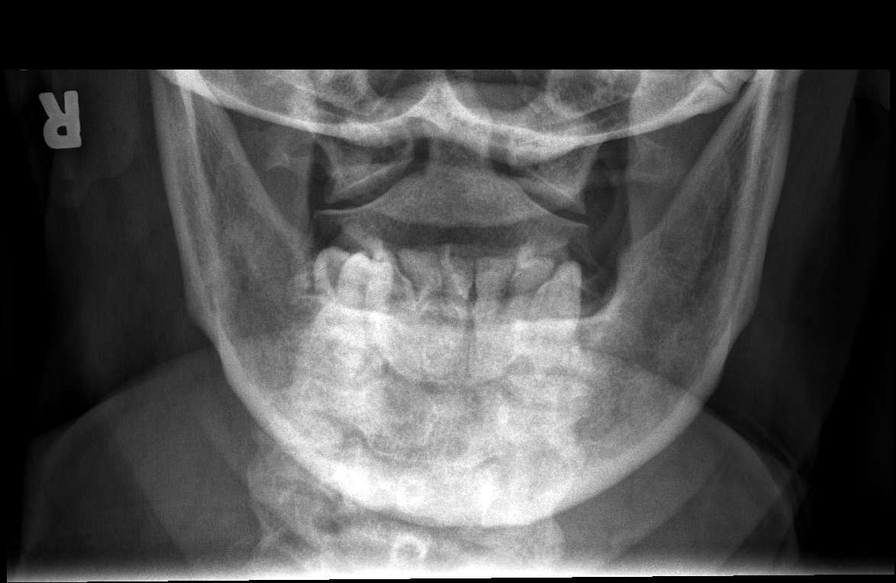

[AP (3 of 3)]
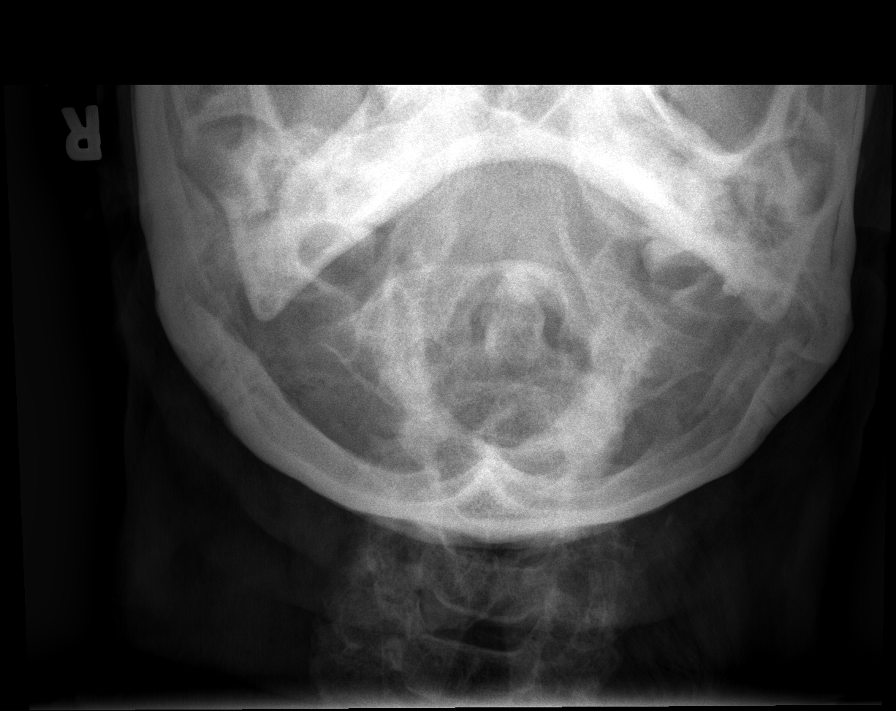

[7 of 7 positions shown; findings below may reference images not displayed]

FINDINGS: Lateral view demonstrates straightening cervical curvature with spondylosis and disc space narrowing C4-5 C5-6 with normal appearance of the soft tissues. AP view demonstrates a slight levocurvature upper cervical spine with bilateral right greater than left uncovertebral joint hypertrophy.
Oblique views demonstrates mild foraminal encroachment to the left C3-C4 and mild foraminal encroachment to the right C3-4 and C4-5. The dens and lateral masses of C1 are intact
IMPRESSION: 
IMPRESSION: Degenerative changes described above
Location: 1
Is the patient pregnant?
No

## 2020-05-09 ENCOUNTER — Encounter: Admit: 2020-05-09 | Payer: PRIVATE HEALTH INSURANCE | Attending: Rheumatology

## 2020-05-10 MED ORDER — GABAPENTIN 400 MG CAPSULE
400 mg | ORAL_CAPSULE | Freq: Every evening | ORAL | 6 refills | Status: AC
Start: 2020-05-10 — End: 2020-10-29

## 2020-05-10 NOTE — Telephone Encounter
Last visit 7-20-2021Next visit 1-5-2022Last labs:Lab Results Component Value Date  WBC 5.8 03/12/2020  HGB 13.9 03/12/2020  HCT 42.0 03/12/2020  MCV 93.5 03/12/2020  MCH 31.0 03/12/2020  MCHC 33.1 03/12/2020  PLT 312 03/12/2020  MPV 9.5 03/12/2020  NEUTROPHILS 59.6 03/12/2020  LYMPHOCYTES 30.7 03/12/2020  MONOCYTES 7.5 03/12/2020  EOSINOPHILS 1.7 03/12/2020  Lab Results Component Value Date  NA 139 03/12/2020  K 4.4 03/12/2020  CL 105 03/12/2020  CO2 26 03/12/2020  GLU 92 03/12/2020  BUN 18 03/12/2020  CREATININE 0.79 03/12/2020  EGFR >60 02/14/2013  EGFRAFRAMER 97 03/12/2020  CALCIUM 9.3 03/12/2020  ALBUMIN 4.5 03/12/2020  PROT 7.4 02/14/2013  BILITOT 0.6 03/12/2020  ALKPHOS 52 03/12/2020  ALT 30 (H) 03/12/2020  AST 21 03/12/2020  GLOB 2.6 03/12/2020  Lab Results Component Value Date  CRPQ 11.7 (H) 03/12/2020  SEDRATE 9 03/12/2020

## 2020-05-17 ENCOUNTER — Encounter: Admit: 2020-05-17 | Payer: PRIVATE HEALTH INSURANCE | Attending: Rheumatology

## 2020-05-17 DIAGNOSIS — M8589 Other specified disorders of bone density and structure, multiple sites: Secondary | ICD-10-CM

## 2020-05-17 DIAGNOSIS — M25551 Pain in right hip: Secondary | ICD-10-CM

## 2020-05-17 DIAGNOSIS — M65329 Trigger finger, unspecified index finger: Secondary | ICD-10-CM

## 2020-05-17 DIAGNOSIS — M47812 Spondylosis without myelopathy or radiculopathy, cervical region: Secondary | ICD-10-CM

## 2020-05-17 DIAGNOSIS — E559 Vitamin D deficiency, unspecified: Secondary | ICD-10-CM

## 2020-05-17 DIAGNOSIS — R7982 Elevated C-reactive protein (CRP): Secondary | ICD-10-CM

## 2020-05-17 DIAGNOSIS — M545 Low back pain, unspecified back pain laterality, unspecified chronicity, unspecified whether sciatica present: Secondary | ICD-10-CM

## 2020-05-17 DIAGNOSIS — M797 Fibromyalgia: Secondary | ICD-10-CM

## 2020-05-17 DIAGNOSIS — M5416 Radiculopathy, lumbar region: Secondary | ICD-10-CM

## 2020-05-17 DIAGNOSIS — G5621 Lesion of ulnar nerve, right upper limb: Secondary | ICD-10-CM

## 2020-05-17 DIAGNOSIS — M47816 Spondylosis without myelopathy or radiculopathy, lumbar region: Secondary | ICD-10-CM

## 2020-05-17 DIAGNOSIS — Z791 Long term (current) use of non-steroidal anti-inflammatories (NSAID): Secondary | ICD-10-CM

## 2020-05-17 DIAGNOSIS — M542 Cervicalgia: Secondary | ICD-10-CM

## 2020-05-17 DIAGNOSIS — G5600 Carpal tunnel syndrome, unspecified upper limb: Secondary | ICD-10-CM

## 2020-05-22 ENCOUNTER — Encounter: Admit: 2020-05-22 | Payer: PRIVATE HEALTH INSURANCE | Attending: Rheumatology

## 2020-05-22 MED ORDER — GABAPENTIN 100 MG CAPSULE
100 mg | ORAL_CAPSULE | Freq: Every evening | ORAL | 2 refills | Status: AC
Start: 2020-05-22 — End: 2021-02-05

## 2020-05-22 NOTE — Telephone Encounter
Last visit 7-20-2021Next visit 1-5-2022Results for Laurie White, Laurie White (MRN [id number]) as of 05/22/2020 13:43 Ref. Range 03/12/2020 10:07 Sodium Latest Ref Range: 135 - 146 mmol/L 139 Potassium Latest Ref Range: 3.5 - 5.3 mmol/L 4.4 Chloride Latest Ref Range: 98 - 110 mmol/L 105 CO2 Latest Ref Range: 20 - 32 mmol/L 26 BUN Latest Ref Range: 7.0 - 25.0 mg/dL 18 Creatinine Latest Ref Range: 0.50 - 1.05 mg/dL 4.09 BUN/Creatinine Ratio Latest Ref Range: 6 - 22 (calc) NOT APPLICABLE eGFR (NON African-American) Latest Ref Range: > OR = 60 mL/min/1.10m2 84 eGFR (Afr Amer) Latest Ref Range: > OR = 60 mL/min/1.20m2 97 Glucose Latest Ref Range: 65.00 - 99.00 mg/dL 92 Calcium Latest Ref Range: 8.6 - 10.4 mg/dL 9.3 Bilirubin, Total Latest Ref Range: 0.2 - 1.2 mg/dL 0.6 Alkaline Phosphatase Latest Ref Range: 37 - 153 U/L 52 ALT (SGPT) Latest Ref Range: 6 - 29 U/L 30 (H) AST Latest Ref Range: 10 - 35 U/L 21 Albumin Latest Ref Range: 3.6 - 5.1 g/dL 4.5 Globulin Latest Ref Range: 1.9 - 3.7 g/dL (calc) 2.6 Albumin/Globulin Ratio Latest Ref Range: 1.0 - 2.5 (calc) 1.7 CRP Latest Ref Range: <8.0 mg/L 11.7 (H) Protein, Total Latest Ref Range: 6.1 - 8.1 g/dL 7.1 WBC Latest Ref Range: 3.8 - 10.8 Thousand/uL 5.8 RBC Latest Ref Range: 3.80 - 5.10 Million/uL 4.49 Hemoglobin Latest Ref Range: 11.7 - 15.5 g/dL 81.1 Hematocrit Latest Ref Range: 35.0 - 45.0 % 42.0 MCV Latest Ref Range: 80.0 - 100.0 fL 93.5 MCH Latest Ref Range: 27.0 - 33.0 pg 31.0 RDW-CV Latest Ref Range: 11.0 - 15.0 % 13.1 Platelets Latest Ref Range: 140 - 400 Thousand/uL 312 MPV Latest Ref Range: 7.5 - 12.5 fL 9.5 Neutrophils Latest Units: % 59.6 Lymphocytes Latest Units: % 30.7 Monocytes Latest Units: % 7.5 Eosinophils Latest Units: % 1.7 Basophils Latest Units: % 0.5 Neutrophils Absolute Latest Ref Range: 1,500 - 7,800 cells/uL 3,457 Lymphocytes Absolute Latest Ref Range: 850 - 3,900 cells/uL 1,781 Monocytes (Absolute) Latest Ref Range: 200 - 950 cells/uL 435 Eosinophils Absolute Latest Ref Range: 15 - 500 cells/uL 99 Basophils Absolute Latest Ref Range: 0 - 200 cells/uL 29 MCHC Latest Ref Range: 32.0 - 36.0 g/dL 91.4 Sed Rate By Modified Westergren Latest Ref Range: < OR = 30 mm/h 9

## 2020-06-12 ENCOUNTER — Telehealth: Admit: 2020-06-12 | Payer: PRIVATE HEALTH INSURANCE

## 2020-06-12 NOTE — Telephone Encounter
RN returned call to patient who requested her blood work orders to be sent to [facility] on [address].She has an appointment for the lab on Monday.A transmission confirmation has been received, RN advised patient to contact our office if any problems Monday.

## 2020-06-14 ENCOUNTER — Encounter: Admit: 2020-06-14 | Payer: PRIVATE HEALTH INSURANCE | Attending: Rheumatology

## 2020-06-14 DIAGNOSIS — M542 Cervicalgia: Secondary | ICD-10-CM

## 2020-06-14 DIAGNOSIS — M8589 Other specified disorders of bone density and structure, multiple sites: Secondary | ICD-10-CM

## 2020-06-14 DIAGNOSIS — R7982 Elevated C-reactive protein (CRP): Secondary | ICD-10-CM

## 2020-06-14 DIAGNOSIS — G5600 Carpal tunnel syndrome, unspecified upper limb: Secondary | ICD-10-CM

## 2020-06-14 DIAGNOSIS — Z791 Long term (current) use of non-steroidal anti-inflammatories (NSAID): Secondary | ICD-10-CM

## 2020-06-14 DIAGNOSIS — E559 Vitamin D deficiency, unspecified: Secondary | ICD-10-CM

## 2020-06-14 DIAGNOSIS — M545 Low back pain, unspecified back pain laterality, unspecified chronicity, unspecified whether sciatica present: Secondary | ICD-10-CM

## 2020-06-14 DIAGNOSIS — M47812 Spondylosis without myelopathy or radiculopathy, cervical region: Secondary | ICD-10-CM

## 2020-06-14 DIAGNOSIS — M47816 Spondylosis without myelopathy or radiculopathy, lumbar region: Secondary | ICD-10-CM

## 2020-06-14 DIAGNOSIS — M797 Fibromyalgia: Secondary | ICD-10-CM

## 2020-06-14 DIAGNOSIS — M5416 Radiculopathy, lumbar region: Secondary | ICD-10-CM

## 2020-06-14 DIAGNOSIS — M65329 Trigger finger, unspecified index finger: Secondary | ICD-10-CM

## 2020-06-18 LAB — C-REACTIVE PROTEIN     (CRP): C-REACTIVE PROTEIN: 7.5 mg/L (ref <8.0)

## 2020-06-18 LAB — SEDIMENTATION RATE (ESR): SED RATE BY MODIFIED WESTERGREN: 6 mm/h (ref <=30)

## 2020-07-12 ENCOUNTER — Encounter: Admit: 2020-07-12 | Payer: PRIVATE HEALTH INSURANCE | Attending: Rheumatology

## 2020-07-12 DIAGNOSIS — M542 Cervicalgia: Secondary | ICD-10-CM

## 2020-07-12 DIAGNOSIS — M797 Fibromyalgia: Secondary | ICD-10-CM

## 2020-07-12 DIAGNOSIS — M545 Low back pain, unspecified back pain laterality, unspecified chronicity, unspecified whether sciatica present: Secondary | ICD-10-CM

## 2020-07-12 DIAGNOSIS — M47812 Spondylosis without myelopathy or radiculopathy, cervical region: Secondary | ICD-10-CM

## 2020-07-12 DIAGNOSIS — M5416 Radiculopathy, lumbar region: Secondary | ICD-10-CM

## 2020-07-12 DIAGNOSIS — M65329 Trigger finger, unspecified index finger: Secondary | ICD-10-CM

## 2020-07-12 DIAGNOSIS — G5600 Carpal tunnel syndrome, unspecified upper limb: Secondary | ICD-10-CM

## 2020-07-12 DIAGNOSIS — R7982 Elevated C-reactive protein (CRP): Secondary | ICD-10-CM

## 2020-07-12 DIAGNOSIS — E559 Vitamin D deficiency, unspecified: Secondary | ICD-10-CM

## 2020-07-12 DIAGNOSIS — Z791 Long term (current) use of non-steroidal anti-inflammatories (NSAID): Secondary | ICD-10-CM

## 2020-07-12 DIAGNOSIS — M47816 Spondylosis without myelopathy or radiculopathy, lumbar region: Secondary | ICD-10-CM

## 2020-07-12 DIAGNOSIS — M8589 Other specified disorders of bone density and structure, multiple sites: Secondary | ICD-10-CM

## 2020-08-06 IMAGING — MR MRI LUMBAR SPINE WO CONTRAST
6 of 9 series · 23 of 48 positions shown · non-contrast
Comparison: none

EXAM: MRI LUMBAR SPINE WITHOUT CONTRAST
TECHNIQUE: Sagittal T1, T2, STIR, and axial T1 and T2-weighted images of the lumbar spine are obtained without contrast.
CLINICAL HISTORY: Lumbago with sciatica, left side
Lumbago with sciatica, right side
Other chronic pain
Spondylosis without myelopathy or radiculopathy, lumbar region
Radiculopathy, lumbar region

[Series 5: survey · sagittal · 8.0mm · 0.88mm/px · 2 of 8 slices shown]
[im 1/8]
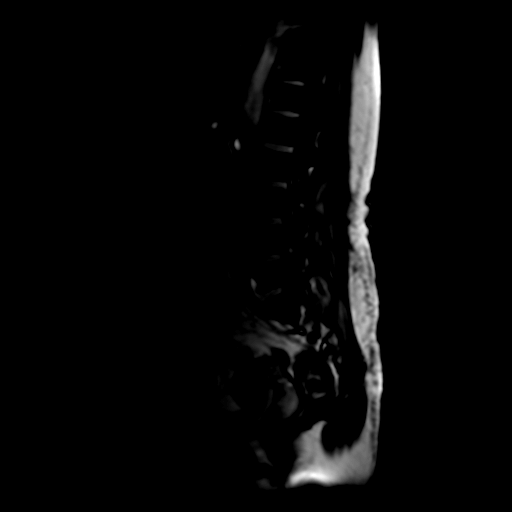
[im 8/8]
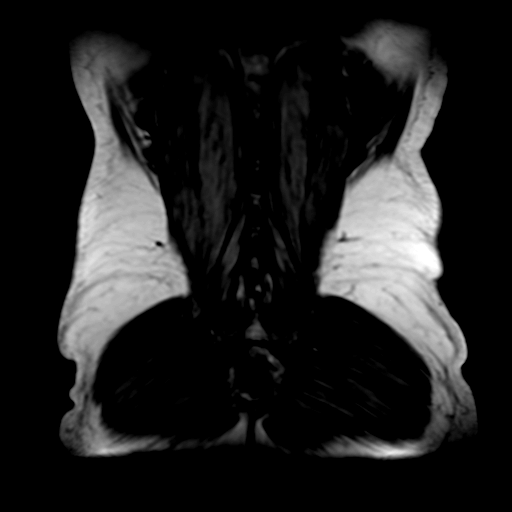

[Series 6: T2 · sagittal · 4.0mm · 0.67mm/px · 3 of 17 slices shown (1 of 2)]
[im 1/17]
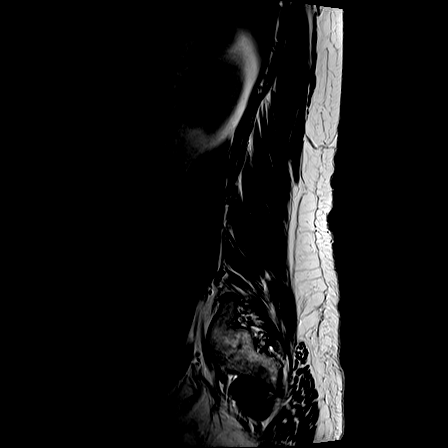
[im 9/17]
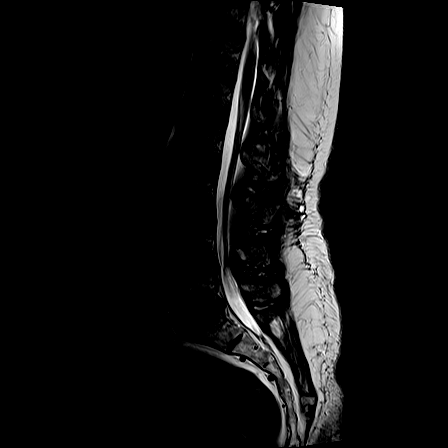
[im 17/17]
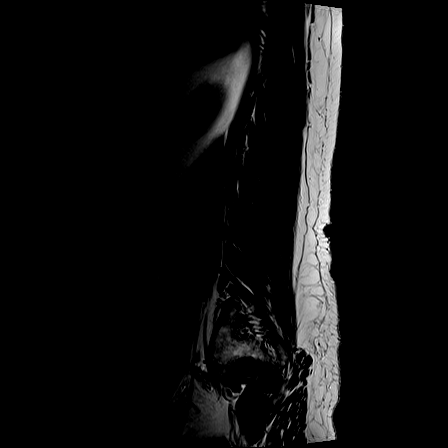

[Series 7: STIR · sagittal · 4.0mm · 0.78mm/px · 3 of 17 slices shown]
[im 1/17]
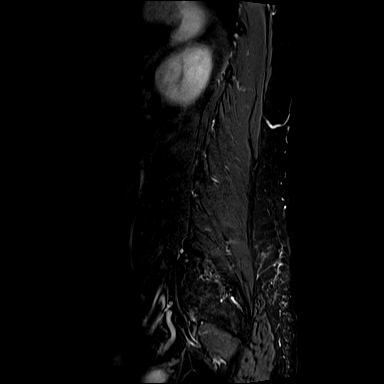
[im 9/17]
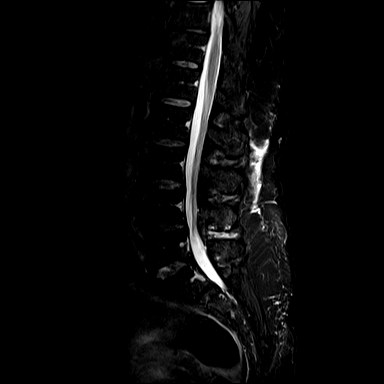
[im 17/17]
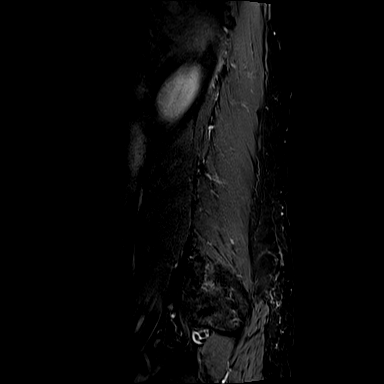

[Series 8: T1 · sagittal · 4.0mm · 0.67mm/px · 3 of 17 slices shown (1 of 2)]
[im 1/17]
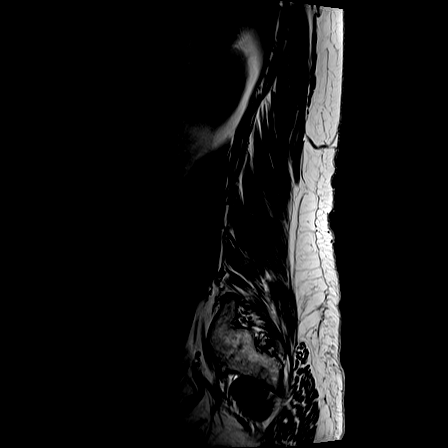
[im 9/17]
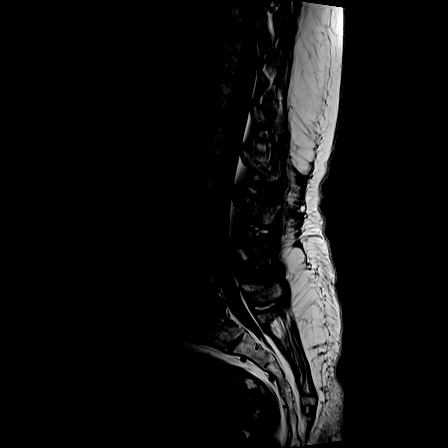
[im 17/17]
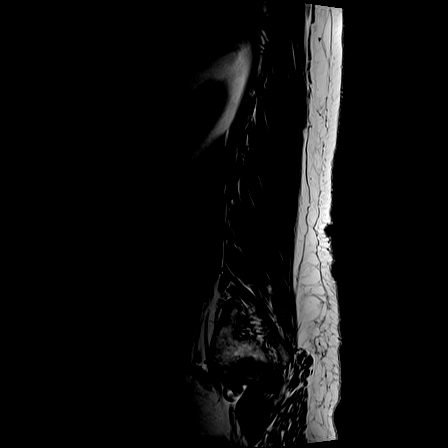

[Series 9: T2 · axial · 4.0mm · 0.49mm/px · z∈[-75,+124]mm · 6 of 31 slices shown (2 of 2)]
[im 1/31]
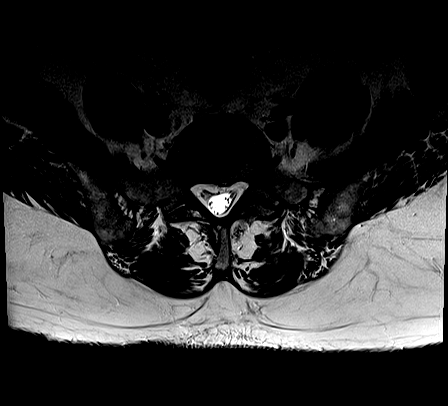
[im 7/31]
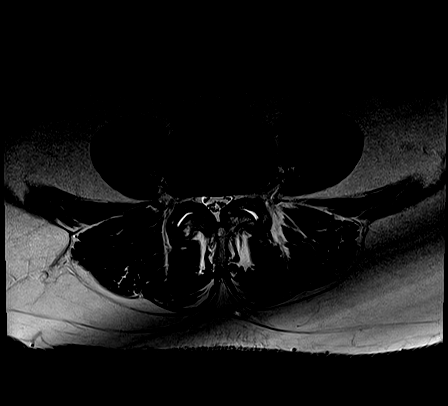
[im 13/31]
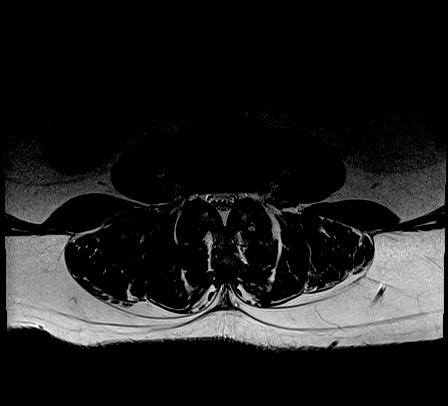
[im 19/31]
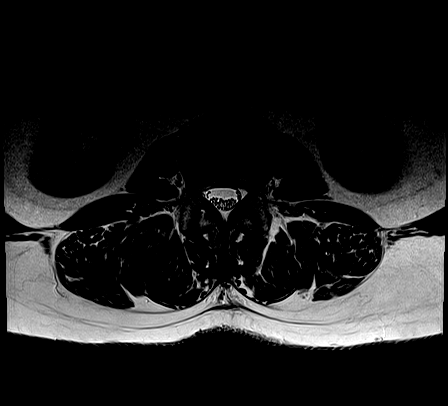
[im 25/31]
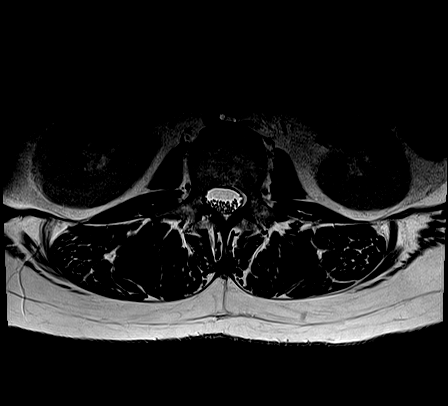
[im 31/31]
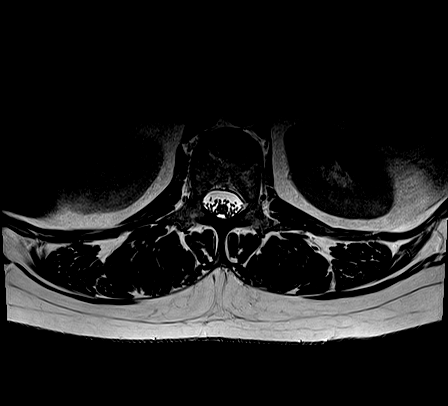

[Series 10: T1 · axial · 4.0mm · 0.49mm/px · z∈[-75,+124]mm · 6 of 31 slices shown (2 of 2)]
[im 1/31]
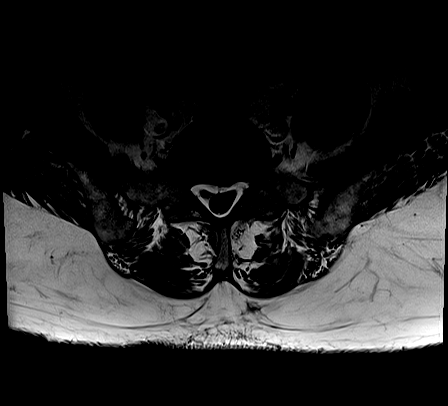
[im 7/31]
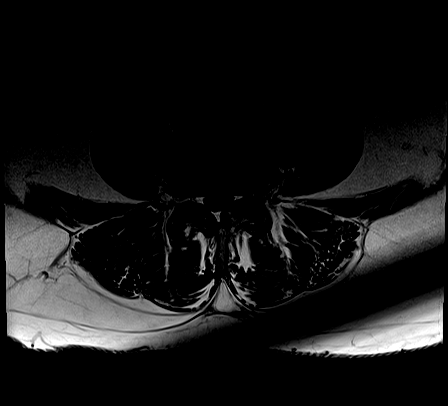
[im 13/31]
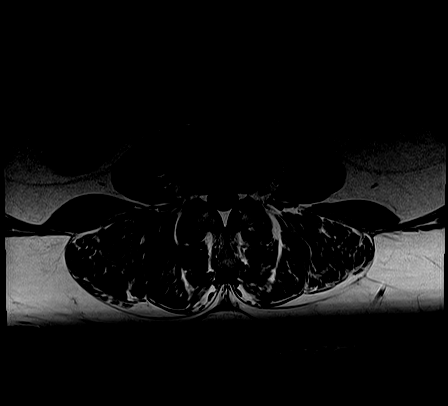
[im 19/31]
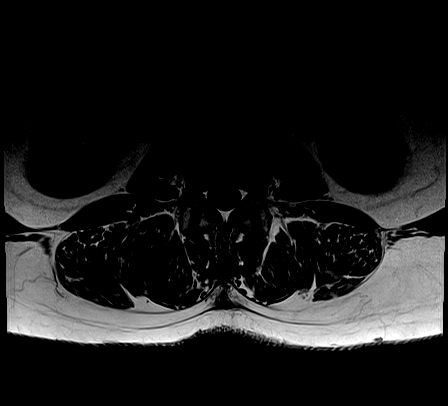
[im 25/31]
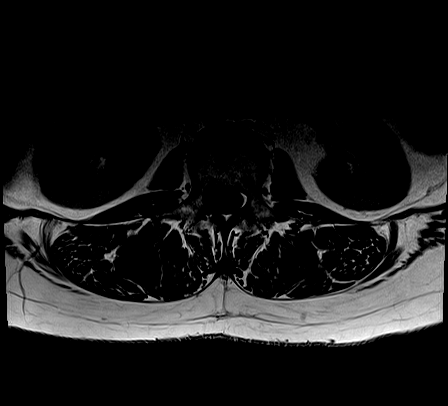
[im 31/31]
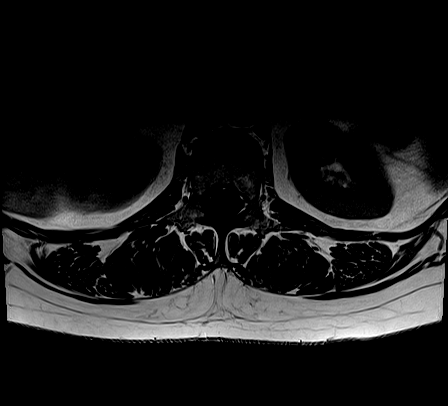

[23 of 48 positions shown; findings below may reference images not displayed]

FINDINGS: Alignment of the vertebral bodies is normal. History of mild anterolisthesis of L4 upon L5 of approximately 3 mm. There is no evidence for pars defect. The overall caliber of the spinal canal is small below the L2-3 level. This is due to increase in the amount of epidural fat. Conus medullaris is seen ending at the L1 level. It has a normal caliber, contour and signal.
At L1-2, there is no canal or neural foraminal stenosis demonstrated.
At L2-3, there is facet arthropathy. There is no canal or neural foraminal stenosis.
At L3-4, disc bulge is seen. There is facet arthropathy. There is no canal stenosis. There is no significant neural foraminal stenosis. The spinal canal caliber is small due to increase in the amount of epidural fat.
At L4-5 there is anterolisthesis of L4 upon L5. There is facet arthropathy with ligamentum flavum hypertrophy. There is the appearance of at least moderate canal stenosis. There is mild bilateral neural foraminal stenosis.
At L5-S1, disc bulge is seen. There is facet arthropathy. There is suggestion of annular fissure. There is no significant canal stenosis. There is no significant neural foraminal stenosis.
The visualized paravertebral soft tissues are grossly normal.
IMPRESSION: Multiple levels of disc disease as described. Please see detailed discussion above.
Location 12
Is the patient pregnant?
No

## 2020-08-09 ENCOUNTER — Encounter: Admit: 2020-08-09 | Payer: PRIVATE HEALTH INSURANCE | Attending: Rheumatology

## 2020-08-09 DIAGNOSIS — M545 Low back pain, unspecified back pain laterality, unspecified chronicity, unspecified whether sciatica present: Secondary | ICD-10-CM

## 2020-08-09 DIAGNOSIS — M8589 Other specified disorders of bone density and structure, multiple sites: Secondary | ICD-10-CM

## 2020-08-09 DIAGNOSIS — M542 Cervicalgia: Secondary | ICD-10-CM

## 2020-08-09 DIAGNOSIS — M47812 Spondylosis without myelopathy or radiculopathy, cervical region: Secondary | ICD-10-CM

## 2020-08-09 DIAGNOSIS — R7982 Elevated C-reactive protein (CRP): Secondary | ICD-10-CM

## 2020-08-09 DIAGNOSIS — M797 Fibromyalgia: Secondary | ICD-10-CM

## 2020-08-09 DIAGNOSIS — Z791 Long term (current) use of non-steroidal anti-inflammatories (NSAID): Secondary | ICD-10-CM

## 2020-08-09 DIAGNOSIS — E559 Vitamin D deficiency, unspecified: Secondary | ICD-10-CM

## 2020-08-09 DIAGNOSIS — M5416 Radiculopathy, lumbar region: Secondary | ICD-10-CM

## 2020-08-09 DIAGNOSIS — M65329 Trigger finger, unspecified index finger: Secondary | ICD-10-CM

## 2020-09-05 ENCOUNTER — Encounter: Admit: 2020-09-05 | Payer: PRIVATE HEALTH INSURANCE | Attending: Rheumatology

## 2020-09-05 MED ORDER — VENLAFAXINE ER 37.5 MG CAPSULE,EXTENDED RELEASE 24 HR
37.5 mg | ORAL_CAPSULE | Freq: Every day | ORAL | 6 refills | Status: AC
Start: 2020-09-05 — End: 2020-11-26

## 2020-09-05 NOTE — Telephone Encounter
Last visit 7-20-2021Next visit 1-5-2022Last labs:Lab Results Component Value Date  WBC 5.4 06/17/2020  HGB 12.4 06/17/2020  HCT 38.0 06/17/2020  MCV 92.9 06/17/2020  MCH 30.3 06/17/2020  MCHC 32.6 06/17/2020  PLT 337 06/17/2020  MPV 9.3 06/17/2020  NEUTROPHILS 57.3 06/17/2020  LYMPHOCYTES 32.0 06/17/2020  MONOCYTES 8.7 06/17/2020  EOSINOPHILS 1.3 06/17/2020  Lab Results Component Value Date  NA 137 06/17/2020  K 4.0 06/17/2020  CL 103 06/17/2020  CO2 24 06/17/2020  GLU 100 06/17/2020  BUN 17 06/17/2020  CREATININE 0.85 06/17/2020  EGFR >60 02/14/2013  EGFRAFRAMER 88 06/17/2020  CALCIUM 9.1 06/17/2020  ALBUMIN 4.4 06/17/2020  PROT 7.4 02/14/2013  BILITOT 0.6 06/17/2020  ALKPHOS 49 06/17/2020  ALT 21 06/17/2020  AST 16 06/17/2020  GLOB 2.7 06/17/2020  Lab Results Component Value Date  CRPQ 7.5 06/17/2020  SEDRATE 6 06/17/2020

## 2020-09-06 ENCOUNTER — Encounter: Admit: 2020-09-06 | Payer: PRIVATE HEALTH INSURANCE | Attending: Rheumatology

## 2020-09-06 DIAGNOSIS — M8589 Other specified disorders of bone density and structure, multiple sites: Secondary | ICD-10-CM

## 2020-09-06 DIAGNOSIS — R7982 Elevated C-reactive protein (CRP): Secondary | ICD-10-CM

## 2020-09-06 DIAGNOSIS — M545 Low back pain, unspecified back pain laterality, unspecified chronicity, unspecified whether sciatica present: Secondary | ICD-10-CM

## 2020-09-06 DIAGNOSIS — Z791 Long term (current) use of non-steroidal anti-inflammatories (NSAID): Secondary | ICD-10-CM

## 2020-09-06 DIAGNOSIS — M797 Fibromyalgia: Secondary | ICD-10-CM

## 2020-09-06 DIAGNOSIS — M5416 Radiculopathy, lumbar region: Secondary | ICD-10-CM

## 2020-09-06 DIAGNOSIS — M65329 Trigger finger, unspecified index finger: Secondary | ICD-10-CM

## 2020-09-06 DIAGNOSIS — E559 Vitamin D deficiency, unspecified: Secondary | ICD-10-CM

## 2020-09-06 DIAGNOSIS — M47812 Spondylosis without myelopathy or radiculopathy, cervical region: Secondary | ICD-10-CM

## 2020-09-06 DIAGNOSIS — M542 Cervicalgia: Secondary | ICD-10-CM

## 2020-09-11 ENCOUNTER — Encounter: Admit: 2020-09-11 | Payer: PRIVATE HEALTH INSURANCE | Attending: Rheumatology

## 2020-09-11 DIAGNOSIS — R7982 Elevated C-reactive protein (CRP): Secondary | ICD-10-CM

## 2020-09-11 DIAGNOSIS — M722 Plantar fascial fibromatosis: Secondary | ICD-10-CM

## 2020-09-11 DIAGNOSIS — E559 Vitamin D deficiency, unspecified: Secondary | ICD-10-CM

## 2020-09-11 DIAGNOSIS — I889 Nonspecific lymphadenitis, unspecified: Secondary | ICD-10-CM

## 2020-09-11 DIAGNOSIS — M25551 Pain in right hip: Secondary | ICD-10-CM

## 2020-09-11 DIAGNOSIS — A6920 Lyme disease, unspecified: Secondary | ICD-10-CM

## 2020-09-11 DIAGNOSIS — M2550 Pain in unspecified joint: Secondary | ICD-10-CM

## 2020-09-11 DIAGNOSIS — M1990 Unspecified osteoarthritis, unspecified site: Secondary | ICD-10-CM

## 2020-09-11 DIAGNOSIS — M797 Fibromyalgia: Secondary | ICD-10-CM

## 2020-09-11 DIAGNOSIS — Q825 Congenital non-neoplastic nevus: Secondary | ICD-10-CM

## 2020-09-11 DIAGNOSIS — G2581 Restless legs syndrome: Secondary | ICD-10-CM

## 2020-09-11 DIAGNOSIS — M47816 Spondylosis without myelopathy or radiculopathy, lumbar region: Secondary | ICD-10-CM

## 2020-09-11 DIAGNOSIS — M6530 Trigger finger, unspecified finger: Secondary | ICD-10-CM

## 2020-09-11 DIAGNOSIS — M47812 Spondylosis without myelopathy or radiculopathy, cervical region: Secondary | ICD-10-CM

## 2020-09-11 DIAGNOSIS — D3610 Benign neoplasm of peripheral nerves and autonomic nervous system, unspecified: Secondary | ICD-10-CM

## 2020-09-11 DIAGNOSIS — R203 Hyperesthesia: Secondary | ICD-10-CM

## 2020-09-11 DIAGNOSIS — Z791 Long term (current) use of non-steroidal anti-inflammatories (NSAID): Secondary | ICD-10-CM

## 2020-09-11 DIAGNOSIS — Z130 Encounter for screening for diseases of the blood and blood-forming organs and certain disorders involving the immune mechanism: Secondary | ICD-10-CM

## 2020-09-11 DIAGNOSIS — M8589 Other specified disorders of bone density and structure, multiple sites: Secondary | ICD-10-CM

## 2020-09-11 DIAGNOSIS — M5416 Radiculopathy, lumbar region: Secondary | ICD-10-CM

## 2020-09-11 DIAGNOSIS — M545 Low back pain, unspecified back pain laterality, unspecified chronicity, unspecified whether sciatica present: Secondary | ICD-10-CM

## 2020-09-11 DIAGNOSIS — L821 Other seborrheic keratosis: Secondary | ICD-10-CM

## 2020-09-11 MED ORDER — VENLAFAXINE IMMEDIATE RELEASE 75 MG TABLET
75 mg | ORAL_TABLET | Freq: Every day | ORAL | 3 refills | Status: AC
Start: 2020-09-11 — End: 2020-11-26

## 2020-09-11 NOTE — Progress Notes
[facility]  [address], [place], [place], [address]  Phone: [phone]  Fax: [phone]  Mickeal Needy Desir, MD  Zenaida Niece, MD  Governor Specking PA-C      Video visit     History of Present Illness  Laurie White is a 60 y.o. female with Fibromyalgia , chronic joint pain in the setting of a normal ESR CRP and negative serologies, negative bone scan who presents for a follow up visit     Last seen 03/26/2020 face to face       History   [name] presented  On 10/31/2012 as  a 60 year old with a strong family history of Inflammatory arthritis in her mother and sister Laurie White) who was well until her early 41s when she developed initial symptoms of numbness in her hands and pain in her fingers. She presented for a Rheumatology consultation for joint pain. 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 She notes she saw a specialist several years ago who she thinks was a neurologist. She had nerve conduction studies which apparently showed carpal tunnel syndrome of unknown severity. She was told to wear wrist splints.      She  had stiffness in her fingers and her hands and knuckles were sore. She also noted pain in her DIP joints.  She saw a Rheumatologist Preston Fleeting MD, who gave her Mobic  and  followed the patient her for 3 years. She notes Stevphen Meuse MD for the last 2 years.  She notes she will not return to that office.    She has had stiffness in neck , and pain in her ankles , feet,   fingers , wrists, and hips.    She has the worst stiffness is for 10 minutes in the mornings, but she has stiffness all day.      She notes she was seen last by Stevphen Meuse MD on 09/13/2012 and was given Prednisone 10 mg po QAM.  She notes she has gained 20 lbs  Over a year.    On  Prednisone. She noted marked improvement. She has less joint pain. She had concerns because she can hear  cracking sound in her neck. She notes persistent stiffness in her ankles  hips are still stiff and  sore.      She noted I am not a good pill taker.    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 She has improved since the injection and with the use of orthotics.  She was diagnosed with Fibromyalgia.  She had a bone scan and labs .  She was taken off Prednisone and diagnosed with a Non inflammatory Polyarthralgias:   She had Very little to suggest inflammatory arthritis on labs , xrays and bone scan.  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 She  had negative serologies including an RF and CCP.  She was also diagnosed with Plantar fasciitis and was advised that she still may develop a  spondylarthropathy. She is HLA B 27 +  A bone scan  was neg for inflam arthritis.  She was diagnosed with Fibromyalgia  She failed Cymbalta and Neurontin and was transitioned to Effexor with good results.    Her last visit was 09/12/2019 via telehealth     Immunization History  Administered Date(s) Administered   ? COVID-19 Vaccine - PFIZER 12/26/2019, 01/16/2020     Not yet had the booster.     Interim history  She's had new symptoms suggestive of RLS     Fibromyalgia update  She is on Effexor 37.5 mg 3 daily form this office for Fibromyalgia which works   She has restless legs and occasional GI upset   Her symptoms improve with hydration       Carpal tunnel update  She had her left carpal tunnel release with Ortho Quincy Carnes MD 09/20/2019 and her right carpal tunnel  11/08/2019    She has no issues since surgery and is pleased with the results       Trigger finger update  S/p Left middle finger 09/20/19, right middle finger and index finger surgery 11/08/2019  She has no residual symptoms     Knee update  She had a left knee injury when her right leg broke through her attic ceiling in 2019.  She has occasional knee pain R>>L, with more severe left knee pain Hypothyroidism update  She is still on Levothyroxine    Labs checked next week      Vitamin D update   She is on vitamin D 2000 IU QD   Due for labs next week     Bone health  She had a normal BMD 10/2018      Spine update   She continues on Neurontin 900 mg   She improved and decided not to seek Ortho eval        Elbow update   She has had right elbow pain with  ulnar entrapment. who sent her for OT 12 times. She is better.   She saw surgeon Roswell Nickel MD  a hand surgeon , an associate in  [facility] office. It is WORK RELATED.  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    She has better shoes      Fall History   She fell down her back porch steps in April 2019.   She has not had any further falls     Urology update   She saw   Dr Wallene Dales , urologist July 2021  She will see him annually following a [place] scan in July 2022  No new issues       Family history   She has a strong family history of Inflammatory arthritis in her mother and sister Laurie White)    She has a strong family history of Inflammatory arthritis in her mother and sister Laurie White)    She lost her mother in June  2016  from Renal Cancer.   Her children are negative for the GENE.    Social History   Married to Laurie White, semi retired.   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. Her house is on the market   Daughter Laurie White  23 (2/9)  Her daughter started an internship in [place]. 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 He and his girlfriend visited.       Medication History  She has failed a number of medications including  Savella, Ultram, Neurontin,  Cymbalta, etodolac,   Lodine, Mobic and  could not afford Celebrex.      HealthCare Team   PCP Thomasenia Bottoms MD  Urology Elie Goody MD  Ortho Elbow -Billey Gosling MD.  Podiatry Barbie Haggis II DPM      Current Outpatient Medications   Medication Sig   ? ergocalciferol Take 1 capsule (50,000 Units total) by mouth once a week.   ? gabapentin Take 1 capsule (100 mg total) by mouth every evening.   ? gabapentin Take 2 capsules (800 mg total) by mouth nightly.   ? levothyroxine 50 mcg daily..   ? venlafaxine XR Take 3 capsules (112.5 mg total) by mouth daily.     No current facility-administered medications for this visit.       No Known Allergies        Review of Systems   220 lbs   Joint pain  Neck pain  Lower back pain      There were no vitals taken for this visit.     limited physical exam was performed due to video visit.     She had mild decrease in ROM of her  neck   L spine could not be assessed      On examination of her joints she had OA changes of her  DIPs,   Trigger finger resolved   Normal PIPs & Normal MCPs   She had full ROM of her wrists, elbows & shoulders.      Labs were reviewed with the patient today  Lab Results   Component Value Date    WBC 5.4 06/17/2020    HGB 12.4 06/17/2020    HCT 38.0 06/17/2020    MCV 92.9 06/17/2020    MCH 30.3 06/17/2020    MCHC 32.6 06/17/2020    PLT 337 06/17/2020    MPV 9.3 06/17/2020    NEUTROPHILS 57.3 06/17/2020    LYMPHOCYTES 32.0 06/17/2020    MONOCYTES 8.7 06/17/2020    EOSINOPHILS 1.3 06/17/2020          Lab Results   Component Value Date    NA 137 06/17/2020    K 4.0 06/17/2020    CL 103 06/17/2020    CO2 24 06/17/2020    GLU 100 06/17/2020    BUN 17 06/17/2020    CREATININE 0.85 06/17/2020    EGFR >60 02/14/2013    EGFRAFRAMER 88 06/17/2020    CALCIUM 9.1 06/17/2020    ALBUMIN 4.4 06/17/2020    PROT 7.4 02/14/2013    BILITOT 0.6 06/17/2020    ALKPHOS 49 06/17/2020    ALT 21 06/17/2020    AST 16 06/17/2020    GLOB 2.7 06/17/2020            Ref. Range 03/12/2020 10:10   Vitamin D, 25 OH, Total Latest Ref Range: 30 - 100 ng/mL 31       Imaging results    03/06/19 XR Hips b/l AP lateral Normal Hips     03/06/2019   XR Lumbar Spine AP/ Lateral   DDD  There is mild L3-4, moderate L4-5, and moderately severe L5-S1 degenerative disc disease with facet arthropathy in the lower lumbar spine.    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   Impresson: No evidence of renal mass, small left inguinal hernia       Bone Density: Exam date 10/18/2018    Region BMD    (  g/cm2) T-score   Z-score   Classification   AP Spine (L1-L4) 1.233  1.7  2.8 Normal   Femoral Neck (Left) 0.943  0.8  1.9 Normal   Total Hip (Left) 1.109  1.4  2.1 Normal        10-year Fracture Risk?:   Major Osteoporotic Fracture 4.6%   Hip Fracture < 0.1%     07/15/2016   BMD Right femur t score -0.7   L1-4  t score 1.7  Left femur  -0.1    Old data     06/29/2016   Lyme IgG WB  positive        11/26/2015    Urine Cx Calcium Oxalate     BMD was normal in 04/13/2013     12/16/2012  Bone scan Mild uptake Right AC joint    11/01/2012 C reactive protein neg at 0.28  Low Vit D level at 25.5   Vit B 12 level 434  ESR 7   CBC normal    ANA neg  RF neg at 6.20, CCP neg   SPEP neg  Parvovirus IgM , IgG neg    Hep screen neg    ACE 44 ( normal)     HLA  B 27 +      Assessment/Plan    Covid vaccine   I advised the booster and we discussed how to make an appointment     Fibromyalgia.  She is currently on Effexor  37.5mg   3 po QD &  Neurontin 800mg  3 hours before bed, per this office   Failed Cymbalta,  Ultram as above.     She has symptoms of hyperesthesia and possible restless legs and I recommend she increase the Effexor to 150 mg QD and she agrees     LBP   I again encouraged core exercises   Her symptoms improved since her last visit and she does not feel ortho eval is needed  Previous XRs showed moderately severe DDD at L2-S2, with severe changes at L5-S1.       Possible RLS   She describes restless legs and will be sent for iron profile labs   She will continue gabapentin QPM     HLA B 27 +   A bone scan  was previously  neg for inflam arthritis.    Nothing to suggest ankylosing spondylitis     KNEE PAIN  She was encouraged to continue weight loss       Bone pain:   BMD was normal  10/2018  Follow up 2025      Health Maintenance   Colonoscopy Jan 29 2016  Had a CPE  04/2020      WEIGHT   She was advised to lose weight  Weight loss will help her joints      68m    Zenaida Niece MD      VIDEO TELEHEALTH VISIT: This clinician is part of the telehealth program and is conducting this visit in a currently approved location. For this visit the clinician and patient were present via interactive audio & video telecommunications system that permits real-time communications.  Patient consent given for video visit: yes    State patient is located in: [place]  The clinician is appropriately licensed in the above state to provide care for this visit.    Other individuals present during the telehealth encounter and their role/relation: none      If billing based on time, please complete (Not required if  billing based on MDM):                             Total time spent in medical video consultation: 40 ; Total time spent by the provider on the day of service, which includes time spent on chart review, medical video consultation, education, coordination of care/services and counseling    Total time spent providing education, coordination of care/services, and counseling: 40  minutes     Because this visit was completed over video, a hands-on physical exam was not performed.  Patient/parent or guardian understands and knows to call back if condition changes.    The visit type for this patient required modifications due to the COVID-19 outbreak.    Patient was advised to practice social distancing for the COVID-19 Coronavirus pandemic.      Scribed for Elenor Quinones, MD MD by Kittie Plater, medical scribe. September 11, 2020  Electronically Signed by Kittie Plater, September 10, 2020          The documentation recorded by the scribe accurately reflects the services I personally performed and the decisions made by me. I reviewed and confirmed all material entered and/or pre-charted by the scribe.    Elenor Quinones, MD    Electronically Signed by Elenor Quinones, MD, September 11, 2020

## 2020-09-11 NOTE — Patient Instructions
Increase Effexor (venlafaxine) to 150 mg daily. Ask the lab to copy your primary care doctor.

## 2020-09-27 LAB — FERRITIN: FERRITIN: 14 ng/mL — ABNORMAL LOW (ref 16–232)

## 2020-09-27 LAB — IRON AND TIBC
IRON BINDING CAPACITY: 402 mcg/dL (calc) (ref 250–450)
IRON, TOTAL: 61 ug/dL (ref 45–160)
SATURATION RATIOS: 15 % (calc) — ABNORMAL LOW (ref 16–45)

## 2020-09-27 LAB — SEDIMENTATION RATE (ESR): SED RATE BY MODIFIED WESTERGREN: 11 mm/h (ref <=30)

## 2020-09-27 LAB — C-REACTIVE PROTEIN     (CRP): C-REACTIVE PROTEIN: 14.7 mg/L — ABNORMAL HIGH (ref <8.0)

## 2020-10-01 ENCOUNTER — Telehealth: Admit: 2020-10-01 | Payer: PRIVATE HEALTH INSURANCE

## 2020-10-01 NOTE — Telephone Encounter
rn [email or web address]

## 2020-10-01 NOTE — Telephone Encounter
Rn spoke to pt and relayed the following msg from [facility] you ask her to talk with her PCP about her low iron per SGDPt verbalized understanding

## 2020-10-01 NOTE — Telephone Encounter
-----   Message from [place], [place] sent at 09/27/2020  2:24 PM EST -----Can you ask her to talk with her PCP about her low iron per SGD----- Message -----From: Elenor Quinones, MDSent: 09/26/2020   7:57 PM ESTTo: Elvina Mattes, PA----- Message -----From: Edi, [facility] Lab Results InSent: 09/26/2020   7:15 PM ESTTo: Elenor Quinones, MD

## 2020-10-04 ENCOUNTER — Encounter: Admit: 2020-10-04 | Payer: PRIVATE HEALTH INSURANCE | Attending: Rheumatology

## 2020-10-04 DIAGNOSIS — Z791 Long term (current) use of non-steroidal anti-inflammatories (NSAID): Secondary | ICD-10-CM

## 2020-10-04 DIAGNOSIS — M542 Cervicalgia: Secondary | ICD-10-CM

## 2020-10-04 DIAGNOSIS — M65329 Trigger finger, unspecified index finger: Secondary | ICD-10-CM

## 2020-10-04 DIAGNOSIS — M797 Fibromyalgia: Secondary | ICD-10-CM

## 2020-10-04 DIAGNOSIS — M47812 Spondylosis without myelopathy or radiculopathy, cervical region: Secondary | ICD-10-CM

## 2020-10-04 DIAGNOSIS — M545 Low back pain, unspecified back pain laterality, unspecified chronicity, unspecified whether sciatica present: Secondary | ICD-10-CM

## 2020-10-04 DIAGNOSIS — E559 Vitamin D deficiency, unspecified: Secondary | ICD-10-CM

## 2020-10-04 DIAGNOSIS — R7982 Elevated C-reactive protein (CRP): Secondary | ICD-10-CM

## 2020-10-04 DIAGNOSIS — M5416 Radiculopathy, lumbar region: Secondary | ICD-10-CM

## 2020-10-04 DIAGNOSIS — M8589 Other specified disorders of bone density and structure, multiple sites: Secondary | ICD-10-CM

## 2020-10-08 ENCOUNTER — Encounter: Admit: 2020-10-08 | Payer: PRIVATE HEALTH INSURANCE | Attending: Rheumatology

## 2020-10-15 NOTE — Telephone Encounter
Rn [email or web address]

## 2020-10-18 IMAGING — MR MRI CERVICAL SPINE WO CONTRAST
6 series · 28 of 48 positions shown · non-contrast
Comparison: none

EXAM: MRI CERVICAL SPINE WITHOUT CONTRAST
Cervicalgia
Other chronic pain
Other cervical disc degeneration, unspecified cervical region
PROCEDURE: Sagittal T1, T2, STIR, and axial fast field echo and fast field echo T2 images of the cervical spine are obtained without contrast.

[Series 201: survey · axial · 10.0mm · 0.94mm/px · z∈[+10,+173]mm · 2 of 9 slices shown]
[im 1/9]
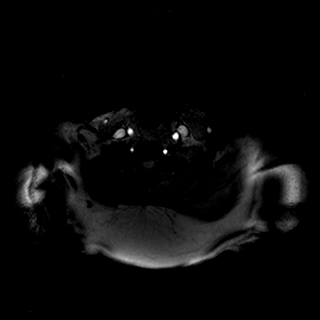
[im 9/9]
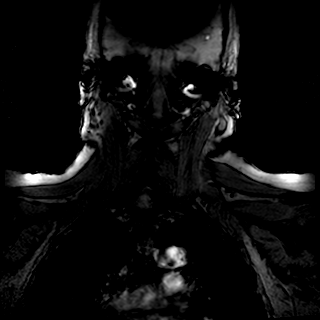

[Series 501: T2 · sagittal · 3.0mm · 0.47mm/px · 5 of 13 slices shown]
[im 1/13]
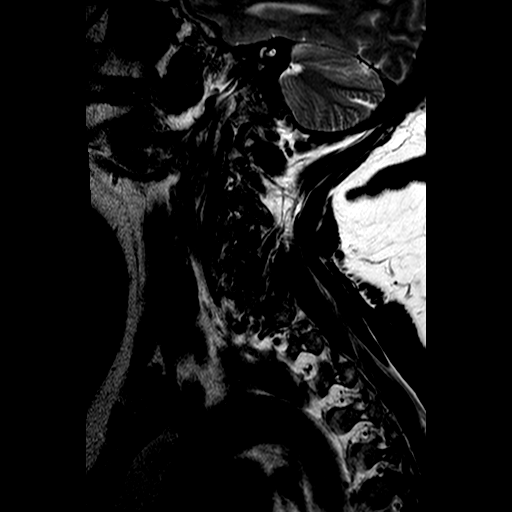
[im 4/13]
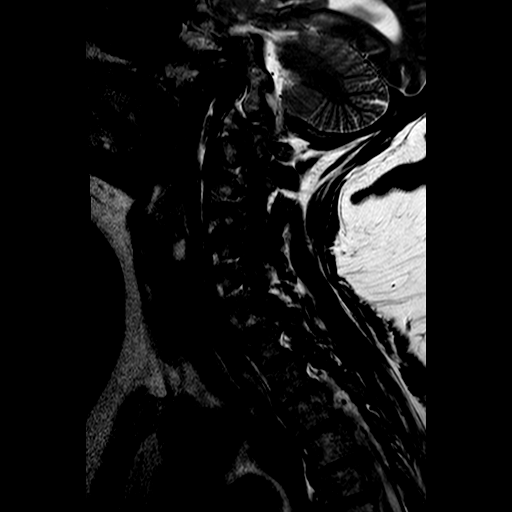
[im 7/13]
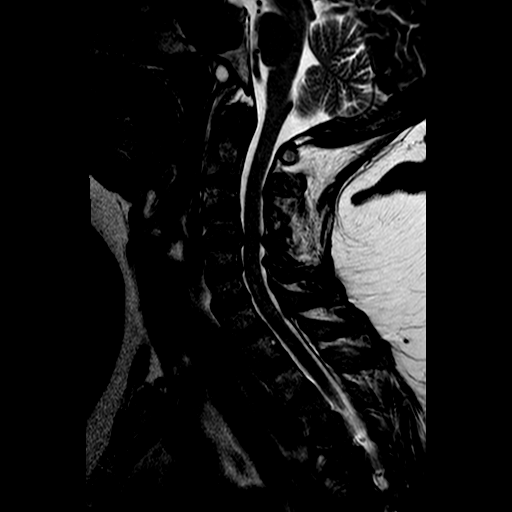
[im 10/13]
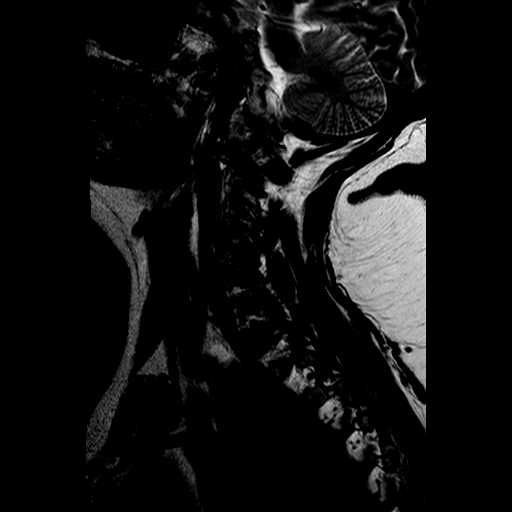
[im 13/13]
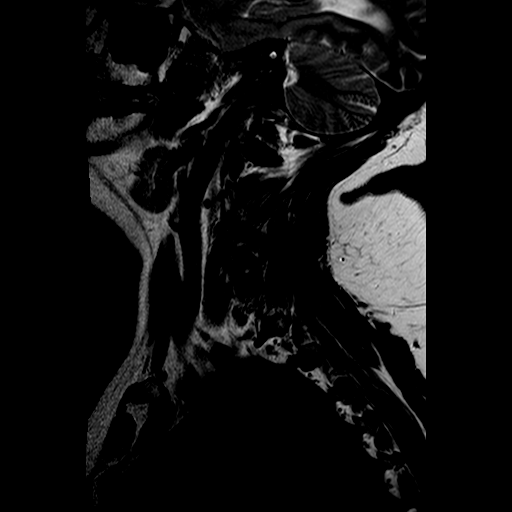

[Series 601: T1 · sagittal · 3.0mm · 0.50mm/px · 5 of 13 slices shown]
[im 1/13]
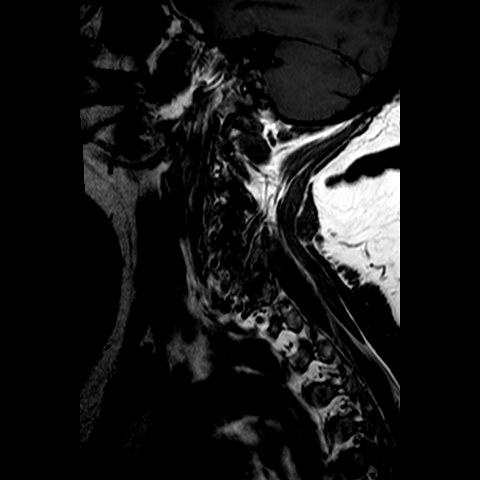
[im 4/13]
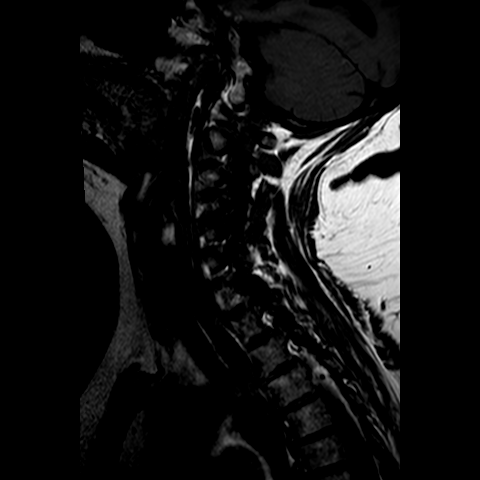
[im 7/13]
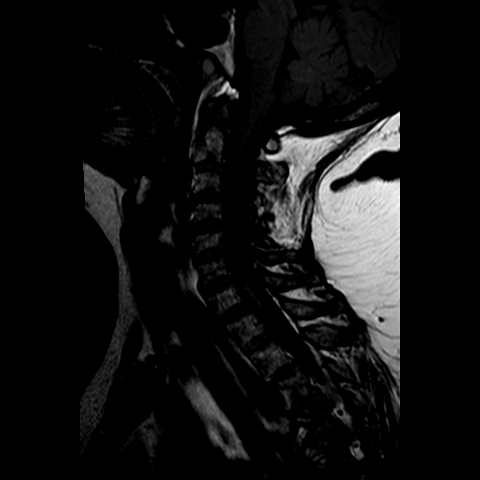
[im 10/13]
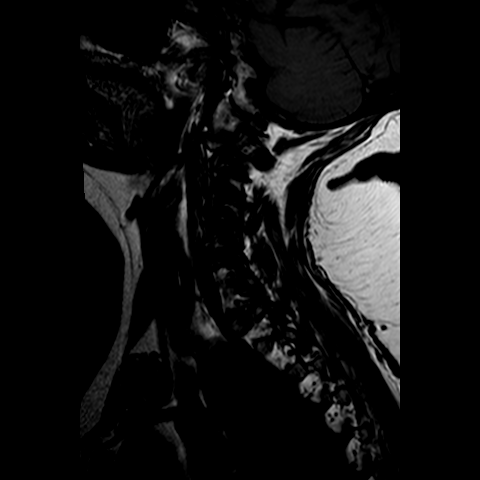
[im 13/13]
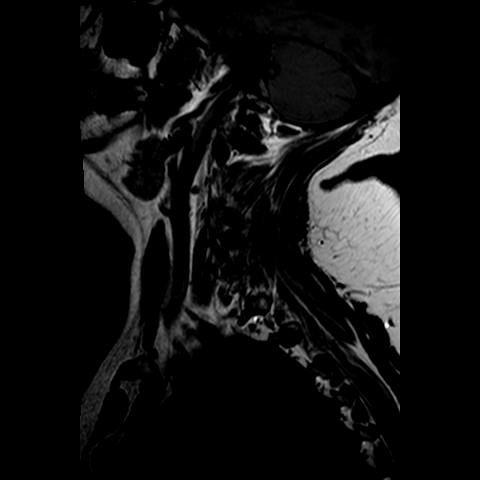

[Series 701: STIR · sagittal · 3.0mm · 0.50mm/px · 4 of 10 slices shown]
[im 1/10]
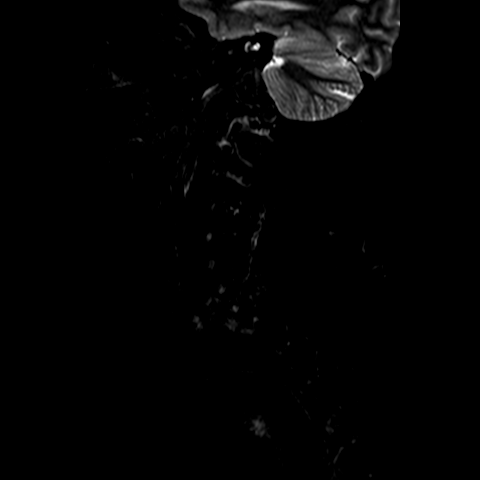
[im 4/10]
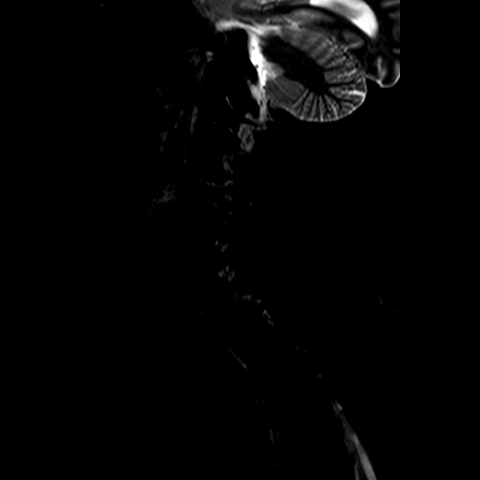
[im 7/10]
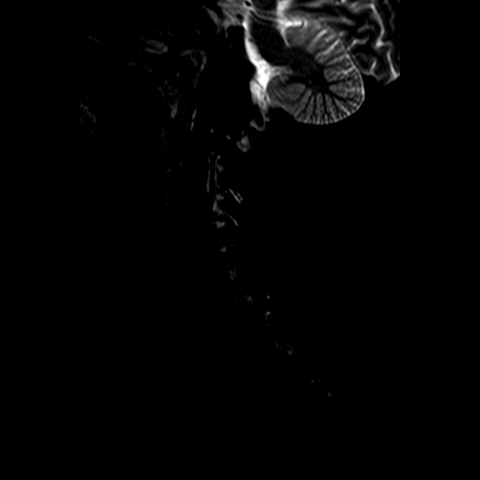
[im 10/10]
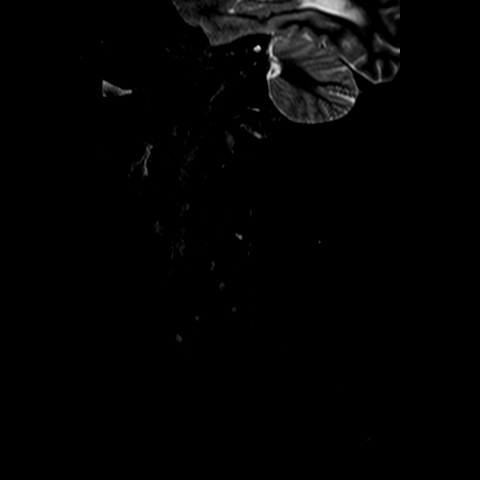

[Series 801: GRE · axial · 4.0mm · 0.50mm/px · z∈[-38,+59]mm · 8 of 24 slices shown]
[im 1/24]
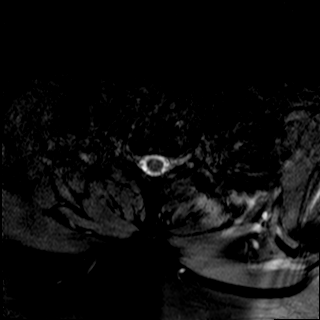
[im 3/24]
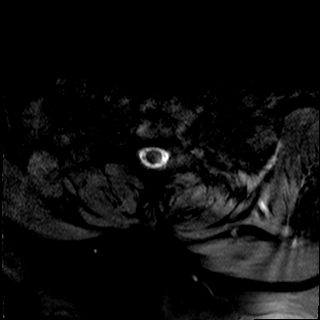
[im 6/24]
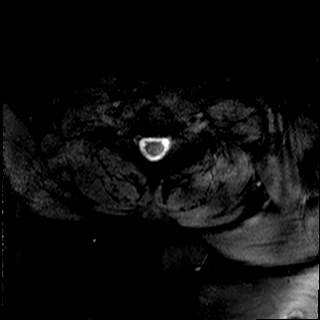
[im 9/24]
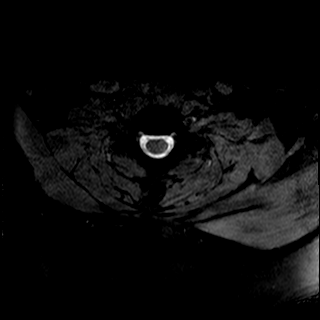
[im 15/24]
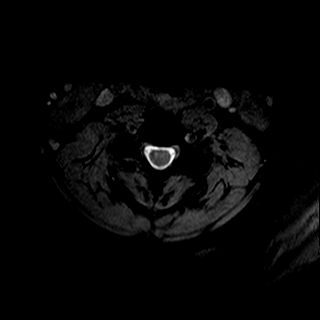
[im 18/24]
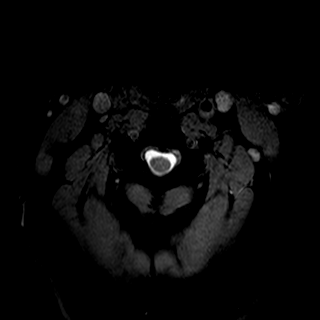
[im 21/24]
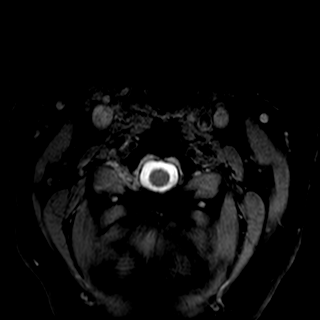
[im 24/24]
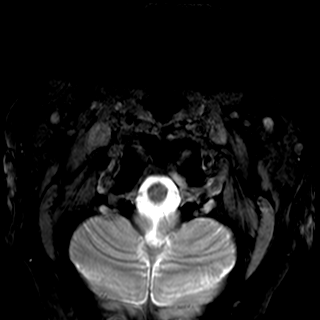

[Series 901: ax b_ffe · axial · 3.0mm · 0.47mm/px · z∈[-32,+7]mm · 4 of 65 slices shown]
[im 3/65]
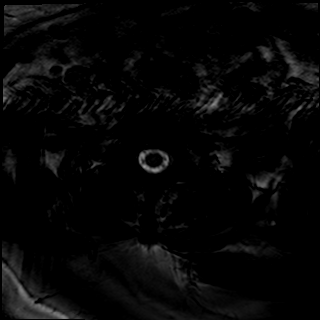
[im 12/65]
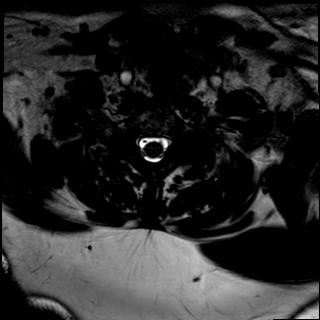
[im 21/65]
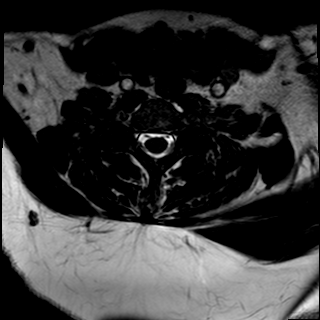
[im 30/65]
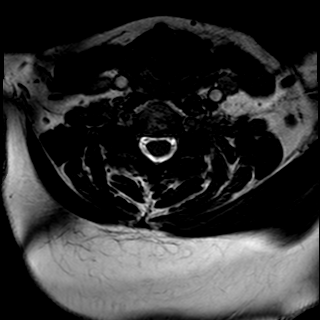

[28 of 48 positions shown; findings below may reference images not displayed]

FINDINGS: Alignment of vertebral bodies is normal. Mild disc space narrowing is noted at C2-3. Vertebral bodies maintain normal height and are normal in signal. Spinal cord maintains normal caliber and is normal in signal. Cerebellar tonsils are normal in position.
Rounded focus of low T1 and high T2 signal is seen in the mucosa of the nasopharynx best seen on the sagittal images. This likely reflects a Thornwald or mucous retention cyst.
At C2-3, there is no canal or neural foraminal stenosis.
At C3-4, there is mild disc bulge osteophyte complex. There is no significant canal or significant neural foraminal stenosis. There is facet arthropathy.
At C3-4, disc bulge osteophyte complex is seen with central ventral protrusion osteophyte complex indenting on the ventral spinal canal and cord. There is ligamentum flavum hypertrophy. There is appearance of mild canal stenosis. There is mild left neural foraminal stenosis.
At C5-6, disc bulge osteophyte complex is seen. There is no significant canal or neural foraminal stenosis.
At C6-7, mild disc bulge is seen flattening the ventral spinal canal. There is no significant canal or neural foraminal stenosis.
The visualized paravertebral soft tissues are grossly normal.
IMPRESSION: Multiple levels of disc disease as described. Please see detailed discussion above.
Location 4
Is the patient pregnant?
No

## 2020-10-28 ENCOUNTER — Encounter: Admit: 2020-10-28 | Payer: PRIVATE HEALTH INSURANCE | Attending: Rheumatology

## 2020-10-29 MED ORDER — GABAPENTIN 400 MG CAPSULE
400 mg | ORAL_CAPSULE | Freq: Every evening | ORAL | 6 refills | Status: AC
Start: 2020-10-29 — End: 2021-03-28

## 2020-10-29 NOTE — Telephone Encounter
Last visit: 1/05/22Next visit: 7/20/22Last labs:Lab Results Component Value Date  WBC 4.6 09/26/2020  HGB 12.8 09/26/2020  HCT 39.3 09/26/2020  MCV 87.1 09/26/2020  MCH 28.4 09/26/2020  MCHC 32.6 09/26/2020  PLT 351 09/26/2020  MPV 9.1 09/26/2020  NEUTROPHILS 51 09/26/2020  LYMPHOCYTES 36.7 09/26/2020  MONOCYTES 8.9 09/26/2020  EOSINOPHILS 2.8 09/26/2020  Lab Results Component Value Date  NA 138 09/26/2020  K 4.1 09/26/2020  CL 104 09/26/2020  CO2 25 09/26/2020  GLU 90 09/26/2020  BUN 18 09/26/2020  CREATININE 0.81 09/26/2020  EGFR >60 02/14/2013  EGFRAFRAMER 93 09/26/2020  CALCIUM 9.0 09/26/2020  ALBUMIN 4.3 09/26/2020  PROT 7.0 12/28/2018  BILITOT 0.6 09/26/2020  ALKPHOS 59 09/26/2020  ALT 35 (H) 09/26/2020  AST 17 09/26/2020  GLOB 2.7 09/26/2020  Lab Results Component Value Date  CRPQ 14.7 (H) 09/26/2020  SEDRATE 11 09/26/2020

## 2020-11-01 ENCOUNTER — Encounter: Admit: 2020-11-01 | Payer: PRIVATE HEALTH INSURANCE | Attending: Rheumatology

## 2020-11-01 DIAGNOSIS — M545 Low back pain, unspecified back pain laterality, unspecified chronicity, unspecified whether sciatica present: Secondary | ICD-10-CM

## 2020-11-01 DIAGNOSIS — E559 Vitamin D deficiency, unspecified: Secondary | ICD-10-CM

## 2020-11-01 DIAGNOSIS — M797 Fibromyalgia: Secondary | ICD-10-CM

## 2020-11-01 DIAGNOSIS — M65329 Trigger finger, unspecified index finger: Secondary | ICD-10-CM

## 2020-11-01 DIAGNOSIS — M8589 Other specified disorders of bone density and structure, multiple sites: Secondary | ICD-10-CM

## 2020-11-01 DIAGNOSIS — M5416 Radiculopathy, lumbar region: Secondary | ICD-10-CM

## 2020-11-01 DIAGNOSIS — R7982 Elevated C-reactive protein (CRP): Secondary | ICD-10-CM

## 2020-11-01 DIAGNOSIS — M542 Cervicalgia: Secondary | ICD-10-CM

## 2020-11-01 DIAGNOSIS — Z791 Long term (current) use of non-steroidal anti-inflammatories (NSAID): Secondary | ICD-10-CM

## 2020-11-01 DIAGNOSIS — M47812 Spondylosis without myelopathy or radiculopathy, cervical region: Secondary | ICD-10-CM

## 2020-11-22 ENCOUNTER — Encounter: Admit: 2020-11-22 | Payer: PRIVATE HEALTH INSURANCE | Attending: Rheumatology

## 2020-11-22 NOTE — Telephone Encounter
Last OV:1/5/22Next OV: 7/20/22Component    Latest Ref Rng & Units 09/26/2020 WBC    3.8 - 10.8 Thousand/uL 4.6 RBC    3.80 - 5.10 Million/uL 4.51 Hemoglobin    11.7 - 15.5 g/dL 16.1 Hematocrit    09.6 - 45.0 % 39.3 MCV    80.0 - 100.0 fL 87.1 MCH    27.0 - 33.0 pg 28.4 MCHC    32.0 - 36.0 g/dL 04.5 RDW-CV    40.9 - 81.1 % 13.6 Platelets    140 - 400 Thousand/uL 351 MPV    7.5 - 12.5 fL 9.1 Neutrophils Absolute    1,500 - 7,800 cells/uL 2,346 Lymphocytes Absolute    850 - 3,900 cells/uL 1,688 Monocytes (Absolute)    200 - 950 cells/uL 409 Eosinophils Absolute    15 - 500 cells/uL 129 Basophils Absolute    0 - 200 cells/uL 28 Neutrophils    % 51 Lymphocytes    % 36.7 Monocytes    % 8.9 Eosinophils    % 2.8 Basophils    % 0.6 Glucose    65.00 - 139.00 mg/dL 90 BUN    7.0 - 91.4 mg/dL 18 Creatinine    7.82 - 1.05 mg/dL 9.56 eGFR (NON African-American)    > OR = 60 mL/min/1.9m2 81 eGFR (Afr Amer)    > OR = 60 mL/min/1.47m2 93 BUN/Creatinine Ratio    6 - 22 (calc) NOT APPLICABLE Sodium    135 - 146 mmol/L 138 Potassium    3.5 - 5.3 mmol/L 4.1 Chloride    98 - 110 mmol/L 104 CO2    20 - 32 mmol/L 25 Calcium    8.6 - 10.4 mg/dL 9.0 Protein, Total    6.1 - 8.1 g/dL 7.0 Albumin    3.6 - 5.1 g/dL 4.3 Globulin    1.9 - 3.7 g/dL (calc) 2.7 Albumin/Globulin Ratio    1.0 - 2.5 (calc) 1.6 Bilirubin, Total    0.2 - 1.2 mg/dL 0.6 Alkaline Phosphatase    37 - 153 U/L 59 AST    10 - 35 U/L 17 ALT (SGPT)    6 - 29 U/L 35 (H) Iron, Total    45 - 160 mcg/dL 61 Iron Binding Capacity    250 - 450 mcg/dL (calc) 213 Saturation Ratios    16 - 45 % (calc) 15 (L) Sed Rate By Modified Westergren    < OR = 30 mm/h 11 CRP    <8.0 mg/L 14.7 (H) Ferritin    16 - 232 ng/mL 14 (L)

## 2020-11-26 ENCOUNTER — Telehealth: Admit: 2020-11-26 | Payer: PRIVATE HEALTH INSURANCE

## 2020-11-26 MED ORDER — VENLAFAXINE IMMEDIATE RELEASE 75 MG TABLET
75 mg | ORAL_TABLET | 3 refills | Status: AC
Start: 2020-11-26 — End: 2021-02-11

## 2020-11-26 NOTE — Telephone Encounter
Patient called to ask for her lab orders to be sent to [facility] in [facility].She has moved and provided her new address which has been updated in her record.

## 2020-11-27 LAB — SEDIMENTATION RATE (ESR): SED RATE BY MODIFIED WESTERGREN: 6 mm/h (ref <=30)

## 2020-11-27 LAB — C-REACTIVE PROTEIN     (CRP): C-REACTIVE PROTEIN: 7.7 mg/L (ref <8.0)

## 2020-11-29 ENCOUNTER — Encounter: Admit: 2020-11-29 | Payer: PRIVATE HEALTH INSURANCE | Attending: Rheumatology

## 2020-11-29 DIAGNOSIS — E559 Vitamin D deficiency, unspecified: Secondary | ICD-10-CM

## 2020-11-29 DIAGNOSIS — M797 Fibromyalgia: Secondary | ICD-10-CM

## 2020-11-29 DIAGNOSIS — M5416 Radiculopathy, lumbar region: Secondary | ICD-10-CM

## 2020-11-29 DIAGNOSIS — M65329 Trigger finger, unspecified index finger: Secondary | ICD-10-CM

## 2020-11-29 DIAGNOSIS — M545 Low back pain, unspecified back pain laterality, unspecified chronicity, unspecified whether sciatica present: Secondary | ICD-10-CM

## 2020-11-29 DIAGNOSIS — M542 Cervicalgia: Secondary | ICD-10-CM

## 2020-11-29 DIAGNOSIS — R7982 Elevated C-reactive protein (CRP): Secondary | ICD-10-CM

## 2020-11-29 DIAGNOSIS — M47812 Spondylosis without myelopathy or radiculopathy, cervical region: Secondary | ICD-10-CM

## 2020-11-29 DIAGNOSIS — Z791 Long term (current) use of non-steroidal anti-inflammatories (NSAID): Secondary | ICD-10-CM

## 2020-11-29 DIAGNOSIS — M8589 Other specified disorders of bone density and structure, multiple sites: Secondary | ICD-10-CM

## 2020-12-27 ENCOUNTER — Encounter: Admit: 2020-12-27 | Payer: PRIVATE HEALTH INSURANCE | Attending: Rheumatology

## 2020-12-27 DIAGNOSIS — M797 Fibromyalgia: Secondary | ICD-10-CM

## 2020-12-27 DIAGNOSIS — M8589 Other specified disorders of bone density and structure, multiple sites: Secondary | ICD-10-CM

## 2020-12-27 DIAGNOSIS — M542 Cervicalgia: Secondary | ICD-10-CM

## 2020-12-27 DIAGNOSIS — M65329 Trigger finger, unspecified index finger: Secondary | ICD-10-CM

## 2020-12-27 DIAGNOSIS — Z791 Long term (current) use of non-steroidal anti-inflammatories (NSAID): Secondary | ICD-10-CM

## 2020-12-27 DIAGNOSIS — M545 Low back pain, unspecified back pain laterality, unspecified chronicity, unspecified whether sciatica present: Secondary | ICD-10-CM

## 2020-12-27 DIAGNOSIS — R7982 Elevated C-reactive protein (CRP): Secondary | ICD-10-CM

## 2020-12-27 DIAGNOSIS — M47812 Spondylosis without myelopathy or radiculopathy, cervical region: Secondary | ICD-10-CM

## 2020-12-27 DIAGNOSIS — E559 Vitamin D deficiency, unspecified: Secondary | ICD-10-CM

## 2020-12-27 DIAGNOSIS — M5416 Radiculopathy, lumbar region: Secondary | ICD-10-CM

## 2021-01-24 ENCOUNTER — Encounter: Admit: 2021-01-24 | Payer: PRIVATE HEALTH INSURANCE | Attending: Rheumatology

## 2021-01-24 DIAGNOSIS — Z791 Long term (current) use of non-steroidal anti-inflammatories (NSAID): Secondary | ICD-10-CM

## 2021-01-24 DIAGNOSIS — R7982 Elevated C-reactive protein (CRP): Secondary | ICD-10-CM

## 2021-01-24 DIAGNOSIS — E559 Vitamin D deficiency, unspecified: Secondary | ICD-10-CM

## 2021-01-24 DIAGNOSIS — M542 Cervicalgia: Secondary | ICD-10-CM

## 2021-01-24 DIAGNOSIS — M797 Fibromyalgia: Secondary | ICD-10-CM

## 2021-01-24 DIAGNOSIS — M545 Low back pain, unspecified back pain laterality, unspecified chronicity, unspecified whether sciatica present: Secondary | ICD-10-CM

## 2021-01-24 DIAGNOSIS — M65329 Trigger finger, unspecified index finger: Secondary | ICD-10-CM

## 2021-01-24 DIAGNOSIS — M8589 Other specified disorders of bone density and structure, multiple sites: Secondary | ICD-10-CM

## 2021-01-24 DIAGNOSIS — M5416 Radiculopathy, lumbar region: Secondary | ICD-10-CM

## 2021-01-24 DIAGNOSIS — M47812 Spondylosis without myelopathy or radiculopathy, cervical region: Secondary | ICD-10-CM

## 2021-02-04 ENCOUNTER — Encounter: Admit: 2021-02-04 | Payer: PRIVATE HEALTH INSURANCE | Attending: Rheumatology

## 2021-02-05 MED ORDER — GABAPENTIN 100 MG CAPSULE
100 mg | ORAL_CAPSULE | Freq: Every evening | ORAL | 6 refills | Status: AC
Start: 2021-02-05 — End: 2021-02-11

## 2021-02-05 NOTE — Telephone Encounter
Last visit 1-5-2022Next visit 8-3-2022Last labs:Lab Results Component Value Date  WBC 5.2 11/26/2020  HGB 13.6 11/26/2020  HCT 41.1 11/26/2020  MCV 91.5 11/26/2020  MCH 30.3 11/26/2020  MCHC 33.1 11/26/2020  PLT 321 11/26/2020  MPV 9.1 11/26/2020  NEUTROPHILS 60.3 11/26/2020  LYMPHOCYTES 29.3 11/26/2020  MONOCYTES 8.0 11/26/2020  EOSINOPHILS 1.6 11/26/2020  Lab Results Component Value Date  NA 138 11/26/2020  K 4.5 11/26/2020  CL 102 11/26/2020  CO2 28 11/26/2020  GLU 97 11/26/2020  BUN 14 11/26/2020  CREATININE 0.83 11/26/2020  EGFR >60 02/14/2013  EGFRAFRAMER 91 11/26/2020  CALCIUM 9.5 11/26/2020  ALBUMIN 4.5 11/26/2020  PROT 7.0 12/28/2018  BILITOT 0.6 11/26/2020  ALKPHOS 56 11/26/2020  ALT 22 11/26/2020  AST 24 11/26/2020  GLOB 2.8 11/26/2020  Lab Results Component Value Date  CRPQ 7.7 11/26/2020  SEDRATE 6 11/26/2020

## 2021-02-07 ENCOUNTER — Encounter: Admit: 2021-02-07 | Payer: PRIVATE HEALTH INSURANCE

## 2021-02-07 NOTE — Telephone Encounter
[facility] [place] [facility] [place]. Per pharmacist it is a controlled substance and an Rx needs to be sent to them by provider.RN has contacted the patient who confirmed she is in [place] and needs refills for her gabapentin 100 gm capsules and venlafaxine 75 mg tablets.Pt of Dr Harlow Mares visit 1-5-2022Next visit: 8-3-2022Last labs:Lab Results Component Value Date  WBC 5.2 11/26/2020  HGB 13.6 11/26/2020  HCT 41.1 11/26/2020  MCV 91.5 11/26/2020  MCH 30.3 11/26/2020  MCHC 33.1 11/26/2020  PLT 321 11/26/2020  MPV 9.1 11/26/2020  NEUTROPHILS 60.3 11/26/2020  LYMPHOCYTES 29.3 11/26/2020  MONOCYTES 8.0 11/26/2020  EOSINOPHILS 1.6 11/26/2020  Lab Results Component Value Date  NA 138 11/26/2020  K 4.5 11/26/2020  CL 102 11/26/2020  CO2 28 11/26/2020  GLU 97 11/26/2020  BUN 14 11/26/2020  CREATININE 0.83 11/26/2020  EGFR >60 02/14/2013  EGFRAFRAMER 91 11/26/2020  CALCIUM 9.5 11/26/2020  ALBUMIN 4.5 11/26/2020  PROT 7.0 12/28/2018  BILITOT 0.6 11/26/2020  ALKPHOS 56 11/26/2020  ALT 22 11/26/2020  AST 24 11/26/2020  GLOB 2.8 11/26/2020  Lab Results Component Value Date  CRPQ 7.7 11/26/2020  SEDRATE 6 11/26/2020

## 2021-02-11 MED ORDER — GABAPENTIN 100 MG CAPSULE
100 mg | ORAL_CAPSULE | Freq: Every evening | ORAL | 6 refills | Status: AC
Start: 2021-02-11 — End: 2021-09-20

## 2021-02-11 MED ORDER — VENLAFAXINE IMMEDIATE RELEASE 75 MG TABLET
75 mg | ORAL_TABLET | Freq: Every day | ORAL | 3 refills | Status: AC
Start: 2021-02-11 — End: 2021-05-27

## 2021-03-25 ENCOUNTER — Ambulatory Visit: Admit: 2021-03-25 | Payer: PRIVATE HEALTH INSURANCE

## 2021-03-25 ENCOUNTER — Telehealth: Admit: 2021-03-25 | Payer: PRIVATE HEALTH INSURANCE

## 2021-03-25 ENCOUNTER — Inpatient Hospital Stay: Admit: 2021-03-25 | Discharge: 2021-03-25 | Payer: PRIVATE HEALTH INSURANCE

## 2021-03-25 ENCOUNTER — Telehealth: Admit: 2021-03-25 | Payer: PRIVATE HEALTH INSURANCE | Attending: Surgery

## 2021-03-25 ENCOUNTER — Encounter: Admit: 2021-03-25 | Payer: PRIVATE HEALTH INSURANCE

## 2021-03-25 DIAGNOSIS — M5416 Radiculopathy, lumbar region: Secondary | ICD-10-CM

## 2021-03-25 DIAGNOSIS — M25551 Pain in right hip: Secondary | ICD-10-CM

## 2021-03-25 DIAGNOSIS — R7982 Elevated C-reactive protein (CRP): Secondary | ICD-10-CM

## 2021-03-25 DIAGNOSIS — Z1509 Genetic susceptibility to other malignant neoplasm: Secondary | ICD-10-CM

## 2021-03-25 DIAGNOSIS — M47812 Spondylosis without myelopathy or radiculopathy, cervical region: Secondary | ICD-10-CM

## 2021-03-25 DIAGNOSIS — M545 Low back pain, unspecified back pain laterality, unspecified chronicity, unspecified whether sciatica present: Secondary | ICD-10-CM

## 2021-03-25 DIAGNOSIS — M8589 Other specified disorders of bone density and structure, multiple sites: Secondary | ICD-10-CM

## 2021-03-25 DIAGNOSIS — M797 Fibromyalgia: Secondary | ICD-10-CM

## 2021-03-25 DIAGNOSIS — M65329 Trigger finger, unspecified index finger: Secondary | ICD-10-CM

## 2021-03-25 DIAGNOSIS — E559 Vitamin D deficiency, unspecified: Secondary | ICD-10-CM

## 2021-03-25 DIAGNOSIS — M542 Cervicalgia: Secondary | ICD-10-CM

## 2021-03-25 DIAGNOSIS — K449 Diaphragmatic hernia without obstruction or gangrene: Secondary | ICD-10-CM

## 2021-03-25 DIAGNOSIS — M47816 Spondylosis without myelopathy or radiculopathy, lumbar region: Secondary | ICD-10-CM

## 2021-03-25 DIAGNOSIS — M2550 Pain in unspecified joint: Secondary | ICD-10-CM

## 2021-03-25 DIAGNOSIS — Z791 Long term (current) use of non-steroidal anti-inflammatories (NSAID): Secondary | ICD-10-CM

## 2021-03-25 DIAGNOSIS — K8020 Calculus of gallbladder without cholecystitis without obstruction: Secondary | ICD-10-CM

## 2021-03-25 MED ORDER — IOHEXOL 350 MG IODINE/ML INTRAVENOUS SOLUTION
350 mg iodine/mL | Freq: Once | INTRAVENOUS | Status: CP | PRN
Start: 2021-03-25 — End: ?
  Administered 2021-03-25: 16:00:00 350 mL via INTRAVENOUS

## 2021-03-25 MED ORDER — SODIUM CHLORIDE 0.9 % LARGE VOLUME SYRINGE FOR AUTOINJECTOR
Freq: Once | INTRAVENOUS | Status: DC | PRN
Start: 2021-03-25 — End: 2021-03-30

## 2021-03-25 NOTE — Telephone Encounter
Patient calling to confirm that there are active lab orders in for [facility].She has an appointment at 10am tomorrow at the [facility] on [address] in [place].Orders placed and faxed to [facility] location requested.Patient has been advised if any issues at [facility] with her orders to call our office.

## 2021-03-25 NOTE — Telephone Encounter
SHannon from [facility] called in, the radiologist is recommending that this patient has a [place] Abdomen W w/o Contrast and a Pelvis WITH IV contrast.  Pt coming in for 12 today.  They have been unsuccessful in getting a response.  Please call [phone] (direct Line)

## 2021-03-26 ENCOUNTER — Encounter: Admit: 2021-03-26 | Payer: PRIVATE HEALTH INSURANCE | Attending: Rheumatology

## 2021-03-27 ENCOUNTER — Encounter: Admit: 2021-03-27 | Payer: PRIVATE HEALTH INSURANCE

## 2021-03-27 LAB — SEDIMENTATION RATE (ESR): SED RATE BY MODIFIED WESTERGREN: 2 mm/h (ref <=30)

## 2021-03-27 LAB — C-REACTIVE PROTEIN     (CRP): C-REACTIVE PROTEIN: 7.4 mg/L (ref <8.0)

## 2021-03-27 NOTE — Telephone Encounter
Patient of Dr Roger Shelter calling for a refill of the gabapentin 400 mg capsules [facility].She is out of the medication and would like to pick up today.Last visit 1-5-2022Next visit: 8-3-2022Last labs:Lab Results Component Value Date  WBC 5.5 03/26/2021  HGB 13.6 03/26/2021  HCT 41.2 03/26/2021  MCV 96.0 03/26/2021  MCH 31.7 03/26/2021  MCHC 33.0 03/26/2021  PLT 307 03/26/2021  MPV 9.5 03/26/2021  NEUTROPHILS 54.9 03/26/2021  LYMPHOCYTES 32.0 03/26/2021  MONOCYTES 8.7 03/26/2021  EOSINOPHILS 3.3 03/26/2021  Lab Results Component Value Date  NA 141 03/26/2021  K 4.1 03/26/2021  CL 105 03/26/2021  CO2 27 03/26/2021  GLU 95 03/26/2021  BUN 12 03/26/2021  CREATININE 0.80 03/26/2021  EGFR >60 02/14/2013  EGFRAFRAMER 91 11/26/2020  CALCIUM 9.2 03/26/2021  ALBUMIN 4.4 03/26/2021  PROT 7.0 12/28/2018  BILITOT 0.7 03/26/2021  ALKPHOS 53 03/26/2021  ALT 23 03/26/2021  AST 19 03/26/2021  GLOB 2.6 03/26/2021  Lab Results Component Value Date  CRPQ 7.4 03/26/2021  SEDRATE 2 03/26/2021

## 2021-03-28 MED ORDER — GABAPENTIN 400 MG CAPSULE
400 mg | ORAL_CAPSULE | Freq: Every evening | ORAL | 1 refills | Status: AC
Start: 2021-03-28 — End: 2021-08-19

## 2021-03-28 NOTE — Telephone Encounter
Patient and would like to know the status of her Gabapentin 400mg  refill. She stated that she has refills but someone from our office needs to call the pharmacy and confirm refill. [facility] [address] [place], [place]. If there is a problem please call patient she stated that she can not go with out this medication.

## 2021-03-28 NOTE — Telephone Encounter
Patient confirmed she has been taking 900 mg total per day.She takes two 400 mg capsules and one 100 mg capsule for several years now.She is out of the 400 mg capsules and would [facility] on [address] in [place].

## 2021-03-31 ENCOUNTER — Encounter: Admit: 2021-03-31 | Payer: PRIVATE HEALTH INSURANCE

## 2021-03-31 ENCOUNTER — Ambulatory Visit: Admit: 2021-03-31 | Payer: PRIVATE HEALTH INSURANCE | Attending: Surgery

## 2021-03-31 ENCOUNTER — Encounter: Admit: 2021-03-31 | Payer: PRIVATE HEALTH INSURANCE | Attending: Surgery

## 2021-03-31 DIAGNOSIS — D3610 Benign neoplasm of peripheral nerves and autonomic nervous system, unspecified: Secondary | ICD-10-CM

## 2021-03-31 DIAGNOSIS — M722 Plantar fascial fibromatosis: Secondary | ICD-10-CM

## 2021-03-31 DIAGNOSIS — M6530 Trigger finger, unspecified finger: Secondary | ICD-10-CM

## 2021-03-31 DIAGNOSIS — Z1509 Genetic susceptibility to other malignant neoplasm: Secondary | ICD-10-CM

## 2021-03-31 DIAGNOSIS — L821 Other seborrheic keratosis: Secondary | ICD-10-CM

## 2021-03-31 DIAGNOSIS — I889 Nonspecific lymphadenitis, unspecified: Secondary | ICD-10-CM

## 2021-03-31 DIAGNOSIS — A6920 Lyme disease, unspecified: Secondary | ICD-10-CM

## 2021-03-31 DIAGNOSIS — Q825 Congenital non-neoplastic nevus: Secondary | ICD-10-CM

## 2021-03-31 DIAGNOSIS — M1990 Unspecified osteoarthritis, unspecified site: Secondary | ICD-10-CM

## 2021-03-31 NOTE — Progress Notes
VIDEO TELEHEALTH VISIT: This clinician is part of the telehealth program and is conducting this visit in a currently approved location. For this visit the clinician and patient were present via interactive audio & video telecommunications system that permits real-time communications, via the [employer].Patient's use of the telehealth platform followed consent and acknowledges agreement to permit telehealth for this visit. [place] patient is located in: [place] clinician is appropriately licensed in the above state to provide care for this visit. Other individuals present during the telehealth encounter and their role/relation: noneBecause this visit was completed over video, a hands-on physical exam was not performed.  Patient/parent or guardian understands and knows to call back if condition changes.Chief Complaint:  The patient is a 60 y.o. female with HLRCCHistory of Present Illness:  Laurie White is a 60 y.o. female who is aformer patient of Dr. Janann Colonel. 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 Her mother had metastatic HLRCC and passed away from [facility] in March 18, 2016. She is feeling well without issuesFifty throughout female with HLRCC with history of fibroids and cutaneous leiomyomas.  Her mother passed away from metastatic [facility].  Ms. Spadaccini children were tested negative for a germ line mutation however her sister's son who was in college tested positive for a germ line mutation in [facility] is doing well without any issues.  She did see a dermatologist did have 1 of her skin lesions treated in the past year. She has been seen by Dr. Janann Colonel and subsequently Dr. Oneita Hurt. She has now transitionedher care to me.  The patient presents with no issues. [place] abdomen and pelvis with and without contrast on 03/06/2019 is negative for a renal mass, stone, or hydronephrosis.Had renal US on 03/18/2020 which showed no evidence of renal mass. Most recently, [place] on 03/25/2021 showed no renal mass. There is cholelithiasis. The patient is doing well and has no complaints. The patient has no gross hematuria, dysuria, or pain, and denies fevers, chills, nausea, emesis, or diarrhea.Creatinine 0.80 - 7/20/2022Medical History:Past Medical History: Diagnosis Date ? Birth mark    lips left breast , right side of neck ? 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 Lymphadenitis 06/2010  Right side of neck , 06/2010 - Keflex and Septra ? Neuroma   foot? ? 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 Plantar fasciitis   injection left heel - Barbie Haggis DPM ? Seborrheic keratosis  ? Trigger finger  Patient Active Problem List  Diagnosis Date Noted ? Elevated C-reactive protein 09/27/2018 ? Vitamin D deficiency, unspecified 09/27/2018 ? Trigger finger, left middle finger 09/27/2018 ? 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 Spondylosis of cervical region without myelopathy or radiculopathy 09/27/2018 ? Cervicalgia 09/27/2018 ? Low back pain, unspecified back pain laterality, unspecified chronicity, unspecified whether sciatica present 09/27/2018 ? Long term (current) use of non-steroidal anti-inflammatories (nsaid) 09/27/2018 ? Lesion of ulnar nerve, right upper limb 09/27/2018 ? Hyperlipidemia, unspecified hyperlipidemia type 09/27/2018 ? Carpal tunnel syndrome, unspecified laterality 09/27/2018 ? Fibromyalgia 09/27/2018 ? 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 BUNIONECTOMY Left 1989 ? CARPAL TUNNEL RELEASE Left 09/20/2019 ? CARPAL TUNNEL RELEASE Right 11/08/2019 ? CESAREAN SECTION  96, 98 ? FOOT SURGERY    numerous ? MYOMECTOMY  12/1992 ? TOE SURGERY Bilateral   Toe surgeries in 1980s both feet ? TONSILLECTOMY  46  age 60 ? TOTAL ABDOMINAL HYSTERECTOMY  03/19/99  one ovary remains 2000 due to Fibroids ? TRIGGER FINGER RELEASE Left 09/20/2019  at time of CTR, left middle finger release ? TRIGGER FINGER RELEASE Right 11/08/2019  Rigt index , and middle finger ? UTERINE FIBROID SURGERY  1984  Fibroid  uterus 1984 removed large Fibroid Family History:Family History Problem Relation Age of Onset ? Kidney cancer Mother  ? Spondyloarthropathy Mother  ? Irritable bowel syndrome Mother       Colitis ? Rheum arthritis Sister  ? Rheum arthritis Paternal Aunt  ? Osteoarthritis Paternal Aunt  ? Osteoporosis Paternal Aunt  History reviewed. No pertinent family history of urologic malignancy.Social History:Social History Socioeconomic History ? Marital status: Married Tobacco Use ? Smoking status: Never Smoker ? Smokeless tobacco: Never Used Substance and Sexual Activity ? Alcohol use: Yes   Alcohol/week: 4.0 standard drinks   Types: 4 Glasses of wine per week ? Drug use: No  Medications:Current Outpatient Medications on File Prior to Visit Medication Sig Dispense Refill ? ergocalciferol (VITAMIN D2) 1,250 mcg (50,000 unit) capsule Take 1 capsule (50,000 Units total) by mouth once a week. 12 capsule 0 ? gabapentin (NEURONTIN) 100 mg capsule Take 1 capsule (100 mg total) by mouth every evening. 30 capsule 5 ? gabapentin (NEURONTIN) 400 mg capsule Take 2 capsules (800 mg total) by mouth nightly. 60 capsule 0 ? levothyroxine (SYNTHROID, LEVOTHROID) 50 MCG tablet 50 mcg daily.Marland Kitchen  0 ? venlafaxine (EFFEXOR) 75 mg Immediate Release tablet Take 2 tablets (150 mg total) by mouth daily. 60 tablet 2 No current facility-administered medications on file prior to visit. Allergies:No Known AllergiesReview of Systems: (ROS was reviewed since prior visit)Constitutional: Denies fatigue, unintentional weight lossEyes: Denies vision changesENT: Denies new problems with hearing or  swallowingCardiovascular: Denies new chest painRespiratory: Denies new shortness of breathGI: Denies new nausea, emesis, constipation, diarrheaGU: As in HPIMusculoskeletal: Denies new joint painIntegumentary: Denies new skin rash, lesionsNeurological: Denies new headache, seizuresPsychiatric: Denies new anxiety, psychosisEndocrine: Denies new sweating, thyroid problemsHematologic/Lymphatic: Denies new bleeding, anemiaAllergic/Immunologic: Denies new fever, chills, rigorsPhysical Exam:Ht 5' 9 (1.753 m)  - Wt 96.2 kg  - BMI 31.31 kg/m? ECOG: 0PAIN ASSESSMENT: 0General Appearance: Alert, cooperative, no distress, well developed,			well nourished, appears stated ageHead: Normocephalic, without obvious abnormality, atraumaticEyes: Conjunctiva and lids within normal limitsEars, Nose, Mouth: External appearance within normal limits, hearing grossly intact Neck: Supple, normal range of motionRespiratory: Non laboredAbdomen: Soft, NT/ND, no guarding, no rebound, no palpable massesBack: No CVA tenderness, no spine tendernessSkin: Color, turgor normal, no visible rashes, lesionsMusculoskeletal: Intact sensation, normal range of motionExtremities: Extremities normal, no cyanosis or edemaPsychiatric: Alert/oriented x 3, affect within normal limits, mood within normal limitsNeurological: Gait stable for age, no focal defects. motor exam grossly intactImaging Studies: [place] ABDOMEN PELVIS W WO IV CONTRAST on 03/25/2021 12:06 PM?INDICATION: History of HLRCC.?COMPARISON: Ultrasound 03/18/2020, [place] 03/06/2019?TECHNIQUE: [place] images of the abdomen and pelvis were obtained before and after the administration of 100 cc intravenous contrast (Omnipaque 350).?FINDINGS:Lung bases: Visualized lung bases are clear.?Hepatobiliary: Cholelithiasis.?Pancreas: Unremarkable.?Spleen: Unremarkable.?Adrenal glands: Unremarkable.?Kidneys: No renal mass.  ?Bowel: No bowel obstruction. Small hiatal hernia.?Abdominal and pelvic lymph nodes: No lymphadenopathy.?Peritoneum: No ascites.?Pelvis: No mass. Hysterectomy. Unchanged rounded structure in the right inguinal canal.?Musculoskeletal system and soft tissue: No aggressive osseous lesion.?IMPRESSION:No renal mass.?Reported And Signed By: Denton Ar, MD  [facility] Radiology and Biomedical ImagingLaboratory: N/APathology: N/AAssessment and Plan: The patient is a 89F with a history of HLRCC undergoing ongoing kidney surveillance. We reviewed her diagnosis and management history to date including recent renal [place] from one week ago showing no  evidence of abnormality. Will continue surveillance consisting of kidney imaging. We have been alternating between renal US and [place] as she wishes to avoid MRI due to claustrophobia. Will order Renal US in 1 year. Discussed findings of cholelithiasis. She is going to see her GI doctor for evaluation of diarrhea. She otherwise has no symptoms and will discuss gallstones.

## 2021-04-08 ENCOUNTER — Telehealth: Admit: 2021-04-08 | Payer: PRIVATE HEALTH INSURANCE | Attending: Rheumatology

## 2021-04-08 NOTE — Telephone Encounter
Patient had to cancel her  August 3rd appt because she will be out of town for a whole month. She was requesting to see Dr Roger Shelter in Oct but she completely booked. She has been r's to 09/23/2021.She would like a refill on all her medication. [facility] [place] or [place]. Either or is fine. Gabapentin is a control substance She takes Gabapentin 400mg  and 100mg  totaling to 900mg . They will only fill 800mg  a month not 900mg . She wants to know if you would be able to help her with this.

## 2021-04-09 ENCOUNTER — Encounter: Admit: 2021-04-09 | Payer: PRIVATE HEALTH INSURANCE | Attending: Rheumatology

## 2021-04-09 ENCOUNTER — Encounter: Admit: 2021-04-09 | Payer: PRIVATE HEALTH INSURANCE

## 2021-04-09 NOTE — Telephone Encounter
Refill request routed to provider.

## 2021-04-09 NOTE — Telephone Encounter
Pt will be returning in October . She been scheduled to see Laurie White on 06/10/2021 and will keep her appt with Dr Roger Shelter for January.

## 2021-04-10 MED ORDER — GABAPENTIN 300 MG CAPSULE
300 mg | ORAL_CAPSULE | Freq: Every evening | ORAL | 1 refills | Status: AC
Start: 2021-04-10 — End: 2021-07-21

## 2021-04-10 NOTE — Telephone Encounter
Patient returned call to RN who informed per provider:She can take 3 of the 300 mg tabs 3 hours before bed She is in agreement with this change and is requesting a 90 day supply to be sent [facility] her local [facility] [place]

## 2021-04-10 NOTE — Telephone Encounter
Patient had to cancel her  August 3rd appt because she will be out of town for a whole month. She was requesting to see Dr Roger Shelter in Oct but she completely booked. She has been r's to 09/23/2021.She would like a refill on all her medication. Please send to [facility] [place] or [place]. Either or is fine. She takes Gabapentin 400mg  and 100mg  totaling to 900mg . They will only fill 800mg  a month not 900mg . She wants to know if you would be able to help her with this.Last visit 1-5-2022Next visit 06-10-2021 w/ Belenda Cruise 09-23-2021 w/ Dr Harlow Mares labs:Lab Results Component Value Date  WBC 5.5 03/26/2021  HGB 13.6 03/26/2021  HCT 41.2 03/26/2021  MCV 96.0 03/26/2021  MCH 31.7 03/26/2021  MCHC 33.0 03/26/2021  PLT 307 03/26/2021  MPV 9.5 03/26/2021  NEUTROPHILS 54.9 03/26/2021  LYMPHOCYTES 32.0 03/26/2021  MONOCYTES 8.7 03/26/2021  EOSINOPHILS 3.3 03/26/2021  Lab Results Component Value Date  NA 141 03/26/2021  K 4.1 03/26/2021  CL 105 03/26/2021  CO2 27 03/26/2021  GLU 95 03/26/2021  BUN 12 03/26/2021  CREATININE 0.80 03/26/2021  EGFR >60 02/14/2013  EGFRAFRAMER 91 11/26/2020  CALCIUM 9.2 03/26/2021  ALBUMIN 4.4 03/26/2021  PROT 7.0 12/28/2018  BILITOT 0.7 03/26/2021  ALKPHOS 53 03/26/2021  ALT 23 03/26/2021  AST 19 03/26/2021  GLOB 2.6 03/26/2021  Lab Results Component Value Date  CRPQ 7.4 03/26/2021  SEDRATE 2 03/26/2021

## 2021-04-10 NOTE — Telephone Encounter
She can take 3 of the 300 mg tabs 3 hours before bed Let her know we are changing the RxRetract the other 2 Rx please She prefers 30 days with 5 RFSGD

## 2021-05-26 ENCOUNTER — Encounter: Admit: 2021-05-26 | Payer: PRIVATE HEALTH INSURANCE | Attending: Rheumatology

## 2021-05-27 MED ORDER — VENLAFAXINE IMMEDIATE RELEASE 75 MG TABLET
75 mg | ORAL_TABLET | 2 refills | Status: AC
Start: 2021-05-27 — End: 2021-07-25

## 2021-05-27 NOTE — Telephone Encounter
Last visit: 1/5/22Next visit: 10/4/22Last labs:Lab Results Component Value Date  WBC 5.5 03/26/2021  HGB 13.6 03/26/2021  HCT 41.2 03/26/2021  MCV 96.0 03/26/2021  MCH 31.7 03/26/2021  MCHC 33.0 03/26/2021  PLT 307 03/26/2021  MPV 9.5 03/26/2021  NEUTROPHILS 54.9 03/26/2021  LYMPHOCYTES 32.0 03/26/2021  MONOCYTES 8.7 03/26/2021  EOSINOPHILS 3.3 03/26/2021  Lab Results Component Value Date  NA 141 03/26/2021  K 4.1 03/26/2021  CL 105 03/26/2021  CO2 27 03/26/2021  GLU 95 03/26/2021  BUN 12 03/26/2021  CREATININE 0.80 03/26/2021  EGFR >60 02/14/2013  EGFRAFRAMER 91 11/26/2020  CALCIUM 9.2 03/26/2021  ALBUMIN 4.4 03/26/2021  PROT 7.0 12/28/2018  BILITOT 0.7 03/26/2021  ALKPHOS 53 03/26/2021  ALT 23 03/26/2021  AST 19 03/26/2021  GLOB 2.6 03/26/2021  Lab Results Component Value Date  SEDRATE 2 03/26/2021

## 2021-06-02 IMAGING — CR XR CHEST 1 VIEW
1 series · 1 of 1 positions shown · non-contrast
Comparison: 09/23/2018

FINAL REPORT:
Chest portable one view
INDICATION: chest pain

[AP]
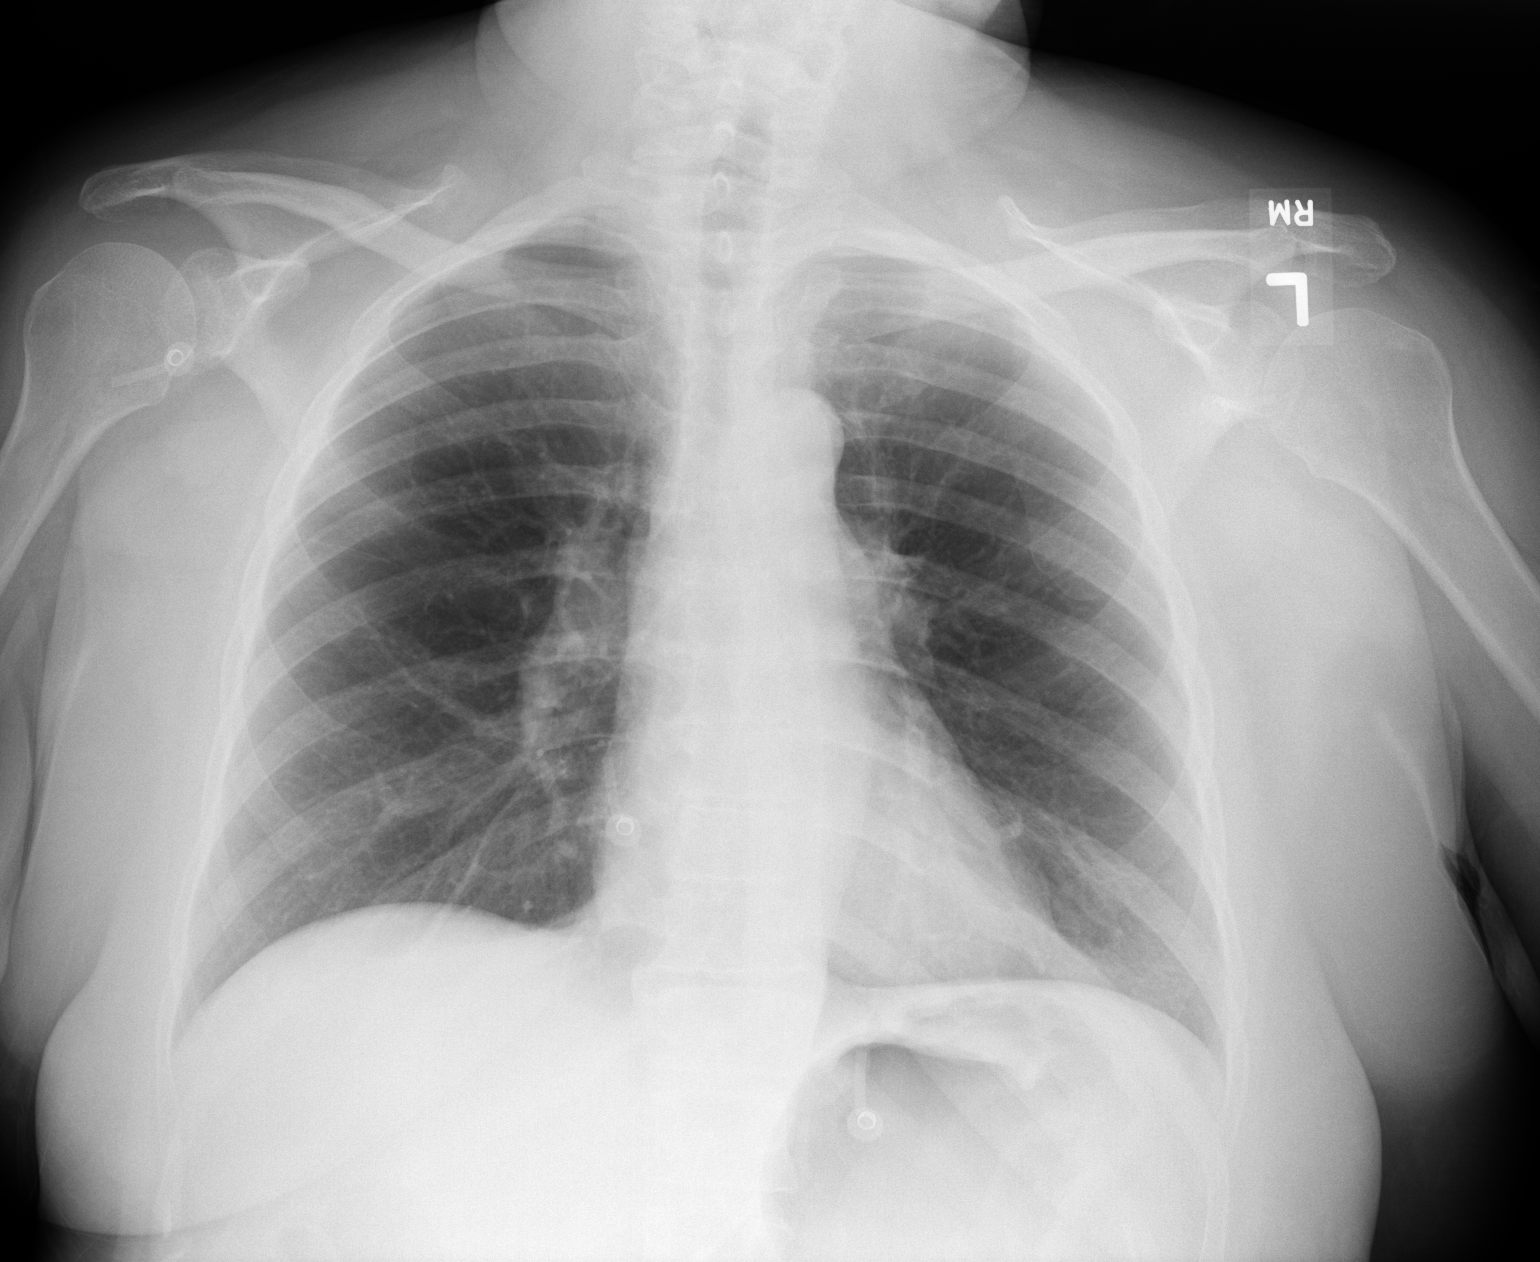

[1 of 1 positions shown; findings below may reference images not displayed]

FINDINGS: The cardiac silhouette is within normal limits   given portable technique.  The lungs are clear.   No pneumothorax. No pleural effusion.   No acute osseous abnormalities.
IMPRESSION: 
IMPRESSION: No acute cardiopulmonary process.
Is the patient pregnant?
No
Portable?
Yes

## 2021-06-10 ENCOUNTER — Inpatient Hospital Stay: Admit: 2021-06-10 | Discharge: 2021-06-10 | Payer: PRIVATE HEALTH INSURANCE

## 2021-06-10 ENCOUNTER — Encounter: Admit: 2021-06-10 | Payer: PRIVATE HEALTH INSURANCE

## 2021-06-10 DIAGNOSIS — D3610 Benign neoplasm of peripheral nerves and autonomic nervous system, unspecified: Secondary | ICD-10-CM

## 2021-06-10 DIAGNOSIS — M545 Low back pain, unspecified back pain laterality, unspecified chronicity, unspecified whether sciatica present: Secondary | ICD-10-CM

## 2021-06-10 DIAGNOSIS — A6920 Lyme disease, unspecified: Secondary | ICD-10-CM

## 2021-06-10 DIAGNOSIS — M25559 Pain in unspecified hip: Secondary | ICD-10-CM

## 2021-06-10 DIAGNOSIS — M6530 Trigger finger, unspecified finger: Secondary | ICD-10-CM

## 2021-06-10 DIAGNOSIS — Q825 Congenital non-neoplastic nevus: Secondary | ICD-10-CM

## 2021-06-10 DIAGNOSIS — M1990 Unspecified osteoarthritis, unspecified site: Secondary | ICD-10-CM

## 2021-06-10 DIAGNOSIS — M25569 Pain in unspecified knee: Secondary | ICD-10-CM

## 2021-06-10 DIAGNOSIS — M722 Plantar fascial fibromatosis: Secondary | ICD-10-CM

## 2021-06-10 DIAGNOSIS — M797 Fibromyalgia: Secondary | ICD-10-CM

## 2021-06-10 DIAGNOSIS — Z1589 Genetic susceptibility to other disease: Secondary | ICD-10-CM

## 2021-06-10 DIAGNOSIS — M5416 Radiculopathy, lumbar region: Secondary | ICD-10-CM

## 2021-06-10 DIAGNOSIS — I889 Nonspecific lymphadenitis, unspecified: Secondary | ICD-10-CM

## 2021-06-10 DIAGNOSIS — L821 Other seborrheic keratosis: Secondary | ICD-10-CM

## 2021-06-10 MED ORDER — VITAMIN D-3 ORAL
ORAL | Status: AC
Start: 2021-06-10 — End: ?

## 2021-06-10 MED ORDER — IRON ORAL
ORAL | Status: AC
Start: 2021-06-10 — End: ?

## 2021-06-10 NOTE — Progress Notes
[facility] [place], [place], [address]DGLOV: [phone]  Fax: [phone] Dyett White, MDSonia, Laurie White, MDKristin Vassie Loll PA-CPatient Name:  Laurie White of Birth:  [birth year]Date of Service: 10/4/2022EPIC MRN:  [id number] Encounter Provider:  Governor Specking, PA-CAttending Physician: Laurie White, MDHistory of Present Illness:1. Fibromyalgia2. Chronic joint painMedication History:Failed Savella, Ultram, Neurontin, Cymbalta, Etodolac, Lodine, Mobic and  could not afford Celebrex.Health Care Team:PCP Laurie White MDUrology Laurie White MDOrtho Elbow -Laurie Rossetti Naujocks MD.Podiatry Laurie White DPMPrior History:Review of Laurie Laurie White Notes:Laurie White presented  On 10/31/2012 as  a 60 year old with a strong family history of Inflammatory arthritis in her mother and sister [name]) who was well until her early 15s when she developed initial symptoms of numbness in her hands and pain in her fingers. She presented for a Rheumatology consultation for joint pain. 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 She notes she saw a specialist several years ago who she thinks was a neurologist. She had nerve conduction studies which apparently showed carpal tunnel syndrome of unknown severity. She was told to wear wrist splints.  ?She  had stiffness in her fingers and her hands and knuckles were sore. She also noted pain in her DIP joints.She saw a Rheumatologist Laurie Fleeting MD, who gave her Mobic  and  followed the patient her for 3 years. She notes Laurie Meuse MD for the last 2 years.  She notes she will not return to that office.?She has had stiffness in neck , and pain in her ankles , feet,   fingers , wrists, and hips.  She has the worst stiffness is for 10 minutes in the mornings, but she has stiffness all day. ?She notes she was seen last by Laurie Meuse MD on 09/13/2012 and was given Prednisone 10 mg po QAM.She notes she has gained 20 lbs  Over a year.?On  Prednisone. She noted marked improvement. She has less joint pain. She had concerns because she can hear  cracking sound in her neck. She notes persistent stiffness in her ankleships are still stiff and  sore.  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 She is HLA B 27 +  A bone scan  was neg for inflam arthritis.She was diagnosed with FibromyalgiaShe failed Cymbalta and Neurontin and was transitioned to Effexor with good results.COVID vaccination:Immunization History Administered Date(s) Administered ? COVID-19 Vaccine - PFIZER 12/26/2019, 01/16/2020 Interval history:She has right hip pain with stair climbing for the past 6 monthsDenies falls/injuriesUpdates:She moved and lives in [place]. She continues to travel to [place] for her medical care but is no longer a [place] resident. Her medical insurance is through [place]. She does not have health insurance in the state of [place].Fibromyalgia:She continues Effexor, at her last visit with Laurie White on 09/11/20 was advised to increase  dose to 150 mg dailyShe has found her current regimen helpfulShe is tolerating her current regimen well without side effectsDenies depressionCarpal tunnel:She had her left carpal tunnel release with Ortho Laurie Carnes MD 09/20/2019 and her right carpal tunnel  11/08/2019  Over the past few days she has numbing and tingling of her left hand pinky and ring fingerShe called Laurie Laurie White this morning and has an appointment tomorrow afternoonTrigger finger:S/p Left middle finger 09/20/19, right middle finger and index finger surgery 11/08/2019 with Laurie Laurie White complaints todayKnee:She had a left knee injury when her right leg broke through her attic ceiling in 2019.She has occasional knee pain R>>L, with more severe left knee pain 'No complaintsSpine:She continues Laurie White 300 mg take 3 capsules at bedtimeShe c/o left leg paresthesias/tingling with sitting in car prolonged periods of time over the past 6-8 months, no symptoms currently todayDenies falls/traumaElbow:She has a history of right ulnar pan with ulnar entrapment She has seen Laurie White in pastThis is WORK RELATED and addressed outside this officePlantar fascitis:She was previously seen for Plantar Fasciitis and 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 calcium carbonate/vitamin D3 (VITAMIN D-3 ORAL) Take by mouth. Take 2000 mg iu ? ferrous sulfate (IRON ORAL) Take by mouth. ? Laurie White (NEURONTIN) 300 mg capsule Take 3 capsules (900 mg total) by mouth nightly. Take 3 hours before bed. ? levothyroxine (SYNTHROID, LEVOTHROID) 50 MCG tablet 50 mcg daily.. ? venlafaxine (EFFEXOR) 75 mg Immediate Release tablet TAKE 2 TABLETS BY MOUTH DAILY. ? ergocalciferol (VITAMIN D2) 1,250 mcg (50,000 unit) capsule Take 1 capsule (50,000 Units total) by mouth once a week. (Patient not taking: Reported on 06/10/2021) Allergies:No Known AllergiesPast Medical History:Past medical history was reviewed and includes the following:Past Medical History: Diagnosis Date ? Birth mark    lips left breast , right side of neck ? Lyme disease 2009  Lyme in child hood and in 2009 ? Lymphadenitis 06/2010  Right side of neck , 06/2010 - Keflex and Septra ? Neuroma   foot? ? 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 Plantar fasciitis   injection left heel - Laurie Haggis DPM ? Seborrheic keratosis  ? Trigger finger  Past Surgical History:Past Surgical History: Procedure Laterality Date ? BUNIONECTOMY Left 1989 ? CARPAL TUNNEL RELEASE Left 09/20/2019 ? CARPAL TUNNEL RELEASE Right 11/08/2019 ? CESAREAN SECTION  96, 98 ? FOOT SURGERY    numerous ? MYOMECTOMY  12/1992 ? TOE SURGERY Bilateral   Toe surgeries in 1980s both feet ? TONSILLECTOMY  68  age 44 ? TOTAL ABDOMINAL HYSTERECTOMY  2000  one ovary remains 2000 due to Fibroids ? TRIGGER FINGER RELEASE Left 09/20/2019  at time of CTR, left middle finger release ? TRIGGER FINGER RELEASE Right 11/08/2019  Rigt index , and middle finger ? UTERINE FIBROID SURGERY  1984  Fibroid  uterus 1984 removed large Fibroid Social History:Married, Francis Dowse is her husband who is semi-retiredLives in [place], [place] from [employer] [place] store and is retiredDaughter Donnald Garre and Son is JustinROS:Constitutional:No fever/chillsNo swollen glandsEye:No vision changesNo diplopiaNo blurry vision with glasses ENT:No tinnitusNo changes in hearingNo mucosal ulcerationsNo dysphagiaNo dizziness Cardiac:No chest pain/chest pressureNo palpitationsNo syncopeNo orthopneaRespiratory:No dyspnea on exertion or at restNo wheezingNo coughNo pleuritic discomfort GI:No nauseaNo vomitingNo diarrheaNo constipationNo BRBPR or black tarry stoolsNo hematemesisNo abdominal pain Musculoskeletal:No muscle aching/muscle weakness GU:No dysuriaNo urinary urgencyNo urinary hesitencyNo  gross hematuriaNo vaginal discharge Skin:No digital ulcerationsNo periungual erythemaNo nail pittingNo patchy alopeciaNo sclerodactylyNo dactylitisNo heliotrope/malar/shawl rashNo erythema nodosumNo psoriasisNo Raynaud's Psych:No depressionNo anxietyThe rest of the review of systems is negative.Physical Examination:Vital signs:  There were no vitals taken for this visit.General: A/O x 3, no apparent distressHEENT: Atraumatic, normocephalic. PERRL/EOMI. OPA patent, no thrushNeck: No JVD, trachea midline, no lympadenopathyHeart: Regular rate and rhythm, S1/S2Lungs: clear, no wheeze/rubs/rhonchi/cracklesMusculoskeletal: Paired positive tender pointsCervical, thoracic, lumbar spine: FROMUE:Shoulders: FROM BIL. Wrists: FROM BIL. No visible bony deformities. Hands: MCP's: no bony deformites, no erythema, no visible joint swelling, no palpable synovial inflammation or pain to palpationPIP's: no Bouchard's nodes, no joint swelling or erythema, no palpable synovial inflammation or pain to palpation, no dactylitisDIP's: no Heberden's nodes, no joint swelling or erythema, no palpable synovial inflammation or pain to palpation, no dactylitisLE:Hips: FROMKnees: FROM BIL knees. No visible joint swelling or erythema. No bony deformitiesMetatarsals: FROM, no visible bony deformities, joint swelling or erythemaExtremities: No clubbing or pitting edema. Neurologic: Normal gait, no focal defecitsSkin: No rashes or ecchymosis. No Raynaud's, sclerodactyly or mechanic's hands.Psych: Normal mood and affectReview of Data:Radiology:DXA ZOX:WRUE Density: Exam date 10/18/2018?Region BMD?(g/cm2) T-score? Z-score? Classification AP Spine (L1-L4) 1.233 ?1.7 ?2.8 Normal Femoral Neck (Left) 0.943 ?0.8 ?1.9 Normal Total Hip (Left) 1.109 ?1.4 ?2.1 Normal ?   10-year Fracture Risk?: Major Osteoporotic Fracture 4.6% Hip Fracture < 0.1% 07/15/2016   BMD Right femur t score -0.7 L1-4  t score 1.7Left femur  -0.111/04/2016   BMD Right femur t score -0.7 L1-4  t score 1.7Left femur  -0.1BMD was normal in 04/13/2013 Other Imaging:03/06/19 XR Hips b/l AP lateral Normal Hips ?03/06/2019   XR Lumbar Spine AP/ Lateral DDDThere is mild L3-4, moderate L4-5, and moderately severe L5-S1 degenerative disc disease with facet arthropathy in the lower lumbar spine.12/16/2012  Bone scan Mild uptake Right AC jointLabs: Latest Reference Range & Units 03/26/21 Sodium 135 - 146 mmol/L 141 Potassium 3.5 - 5.3 mmol/L 4.1 Chloride 98 - 110 mmol/L 105 CO2 20 - 32 mmol/L 27 BUN 7 - 25 mg/dL 12 Creatinine 4.54 - 0.98 mg/dL 1.19 BUN/Creatinine Ratio 6 - 22 (calc) NOT APPLICABLE eGFR (Creatinine) > OR = 60 mL/min/1.68m2 85 Glucose 65 - 99 mg/dL 95 Calcium 8.6 - 14.7 mg/dL 9.2 Bilirubin, Total 0.2 - 1.2 mg/dL 0.7 Alkaline Phosphatase 37 - 153 U/L 53 ALT (SGPT) 6 - 29 U/L 23 AST 10 - 35 U/L 19 Albumin 3.6 - 5.1 g/dL 4.4 Globulin 1.9 - 3.7 g/dL (calc) 2.6 Albumin/Globulin Ratio 1.0 - 2.5 (calc) 1.7 CRP <8.0 mg/L 7.4 Protein, Total 6.1 - 8.1 g/dL 7.0 WBC 3.8 - 82.9 Thousand/uL 5.5 RBC 3.80 - 5.10 Million/uL 4.29 Hemoglobin 11.7 - 15.5 g/dL 56.2 Hematocrit 13.0 - 45.0 % 41.2 MCV 80.0 - 100.0 fL 96.0 MCH 27.0 - 33.0 pg 31.7 MCHC 32.0 - 36.0 g/dL 86.5 RDW-CV 78.4 - 69.6 % 13.4 Platelets 140 - 400 Thousand/uL 307 MPV 7.5 - 12.5 fL 9.5 Neutrophils % 54.9 Monocytes % 8.7 Eosinophils % 3.3 Basophils % 1.1 Neutrophils Absolute 1,500 - 7,800 cells/uL 3,020 Lymphocytes Absolute 850 - 3,900 cells/uL 1,760 Monocytes (Absolute) 200 - 950 cells/uL 479 Basophils Absolute 0 - 200 cells/uL 61 Lymphocytes % 32.0 Eosinophils Absolute 15 - 500 cells/uL 182 Sed Rate By Modified Westergren < OR = 30 mm/h 2 Old Data for reference:11/26/2015  Urine Cx Calcium Oxalate 11/01/2012 C reactive protein neg at 0.28Low Vit D level at 25.5 Vit B 12 level 434ESR 7 CBC normal?ANA negRF neg at 6.20,  CCP neg SPEP negParvovirus IgM , IgG neg?Hep screen neg?ACE 44 ( normal) ?HLA  B 27 +?Assessment/Plan:?	Fibromyalgia -	Current treatment: Effexor 37.5 mg 3 po QD, Neurontin 300 mg take 3 capsules prior to bed-	She reports tolerating her current regimen well without side effects	?	Low back paino	She has symptoms concerning for left sided lumbar radiculopathy which she feels have increased over the past 6-8 monthso	She has mild L3-4, moderate L4-5, and moderately severe L5-S1 degenerative disc disease with facet arthropathy in the lower lumbar spine (x-rays 02/2019)o	Advised to update L-spine xrayso	Referral placed to PT and Ortho Spine for evaluationo	She continues Neurontin?	Hip pain, righto	Prior normal hip x-rays (03/06/19)o	She notes increasing right hip pain with stair climbing, advised to update BIL hip x-rays?	HLA B-27 positive o	Prior bone scan negative for inflammatory arthritis?	Knee paino	Weight loss has been advisedo	No complaints today?	Bone Health:-	Last DEXA: 10/2018 was normal-	Next DEXA due:-	Advised calcium, vitamin D-	Advised to perform weight bearing exercises-	Counseled on fall precautions including removing area rugs from home, non slip shower mats, use of toilet and shower rails and use of a shower chair, use of mobility assistance devices for ambulation, use of night lights and non slip shoes for ambulation, ensuring no loose wires or cords in home, etcRTC in 3 months with Laurie Lorin [facility] Signed by Bettina Gavia, PA, June 10, 2021

## 2021-06-12 ENCOUNTER — Telehealth: Admit: 2021-06-12 | Payer: PRIVATE HEALTH INSURANCE

## 2021-06-12 NOTE — Telephone Encounter
[facility], [place], [place], [address]UEAVW: [phone]  Fax: [phone] Dyett Desir, MDSonia, Kallie Locks, MDKristin Vassie Loll PA-CStudy: XR LUMBAR SPINE AP AND LATERAL 10/6/2022Comparison: XR LUMBAR SPINE AP AND LATERAL 2020-Jun-29FINDINGS:Five non-rib-bearing, lumbar-type vertebra are visualized.There is normal alignment of the lumbar spine without evidence of fracture or subluxation.Vertebral body and disc heights are maintained.There is degenerative disc disease at L3-L4, L4-L5, and L5 to S1 with facet arthropathy.There is mild degenerative disease of the sacroiliac joints with subchondral sclerosis, left more than right.IMPRESSION:Multilevel degenerative changes of the lower lumbar spine.STUDY: XR HIPS BILATERAL AP LATERAL W AP PELVIS 10/6/22COMPARISON: XR HIPS BILATERAL AP LATERAL W AP PELVIS 2020-Jun-29FINDINGS:There is no acute fracture or dislocation.There are mild degenerative changes involving bilateral hip joints.The soft tissues are unremarkable.Degenerative changes of the lumbar spine again notedIMPRESSION:Mild degenerative changes involving bilateral hip joints.??comments:Reviewed x-ray results with patientMultilevel DDD L spine, placed referral to PT and Ortho spine at her last visitMild OA BIL hips, new from 2020 x-rayShe wants to hold off on addressing these issues right now and notes Dr Lurlean Leyden doing nerve conduction study for her upper extremities and she wants to focus on this firstElectronically Signed by Bettina Gavia, PA, June 12, 2021

## 2021-06-17 LAB — C-REACTIVE PROTEIN     (CRP): C-REACTIVE PROTEIN: 10.8 mg/L — ABNORMAL HIGH (ref <8.0)

## 2021-06-17 LAB — SEDIMENTATION RATE (ESR): SED RATE BY MODIFIED WESTERGREN: 6 mm/h (ref <=30)

## 2021-07-21 ENCOUNTER — Encounter: Admit: 2021-07-21 | Payer: PRIVATE HEALTH INSURANCE | Attending: Rheumatology

## 2021-07-21 MED ORDER — GABAPENTIN 300 MG CAPSULE
300 mg | ORAL_CAPSULE | Freq: Every evening | ORAL | 3 refills | Status: AC
Start: 2021-07-21 — End: 2021-09-23

## 2021-07-21 NOTE — Telephone Encounter
Last visit: 10/04/2022Next visit: 1/17/2023Last labs:Lab Results Component Value Date  WBC 4.9 06/16/2021  HGB 14.3 06/16/2021  HCT 41.8 06/16/2021  MCV 94.8 06/16/2021  MCH 32.4 06/16/2021  MCHC 34.2 06/16/2021  PLT 320 06/16/2021  MPV 9.5 06/16/2021  NEUTROPHILS 57 06/16/2021  LYMPHOCYTES 32.7 06/16/2021  MONOCYTES 7.0 06/16/2021  EOSINOPHILS 2.5 06/16/2021  Lab Results Component Value Date  NA 140 06/16/2021  K 4.6 06/16/2021  CL 103 06/16/2021  CO2 28 06/16/2021  GLU 102 06/16/2021  BUN 18 06/16/2021  CREATININE 0.85 06/16/2021  EGFR >60 02/14/2013  EGFRAFRAMER 91 11/26/2020  CALCIUM 9.4 06/16/2021  ALBUMIN 4.5 06/16/2021  PROT 7.0 12/28/2018  BILITOT 0.7 06/16/2021  ALKPHOS 55 06/16/2021  ALT 22 06/16/2021  AST 16 06/16/2021  GLOB 2.7 06/16/2021  Lab Results Component Value Date  CRPQ 10.8 (H) 06/16/2021  SEDRATE 6 06/16/2021

## 2021-07-24 ENCOUNTER — Encounter: Admit: 2021-07-24 | Payer: PRIVATE HEALTH INSURANCE | Attending: Rheumatology

## 2021-07-25 MED ORDER — VENLAFAXINE IMMEDIATE RELEASE 75 MG TABLET
75 mg | ORAL_TABLET | 2 refills | Status: AC
Start: 2021-07-25 — End: 2021-08-18

## 2021-07-25 NOTE — Telephone Encounter
Last visit: 10/4/22Next visit: 1/17/23Last labs:Lab Results Component Value Date  WBC 4.9 06/16/2021  HGB 14.3 06/16/2021  HCT 41.8 06/16/2021  MCV 94.8 06/16/2021  MCH 32.4 06/16/2021  MCHC 34.2 06/16/2021  PLT 320 06/16/2021  MPV 9.5 06/16/2021  NEUTROPHILS 57 06/16/2021  LYMPHOCYTES 32.7 06/16/2021  MONOCYTES 7.0 06/16/2021  EOSINOPHILS 2.5 06/16/2021  Lab Results Component Value Date  NA 140 06/16/2021  K 4.6 06/16/2021  CL 103 06/16/2021  CO2 28 06/16/2021  GLU 102 06/16/2021  BUN 18 06/16/2021  CREATININE 0.85 06/16/2021  EGFR >60 02/14/2013  EGFRAFRAMER 91 11/26/2020  CALCIUM 9.4 06/16/2021  ALBUMIN 4.5 06/16/2021  PROT 7.0 12/28/2018  BILITOT 0.7 06/16/2021  ALKPHOS 55 06/16/2021  ALT 22 06/16/2021  AST 16 06/16/2021  GLOB 2.7 06/16/2021  Lab Results Component Value Date  SEDRATE 6 06/16/2021

## 2021-08-17 ENCOUNTER — Encounter: Admit: 2021-08-17 | Payer: PRIVATE HEALTH INSURANCE | Attending: Rheumatology

## 2021-08-18 MED ORDER — VENLAFAXINE IMMEDIATE RELEASE 75 MG TABLET
75 mg | ORAL_TABLET | Freq: Every day | ORAL | 2 refills | Status: AC
Start: 2021-08-18 — End: 2021-09-10

## 2021-08-18 NOTE — Telephone Encounter
Last visit: 10/04/2022Next visit: 1/17/2023Last labs:Lab Results Component Value Date  WBC 4.9 06/16/2021  HGB 14.3 06/16/2021  HCT 41.8 06/16/2021  MCV 94.8 06/16/2021  MCH 32.4 06/16/2021  MCHC 34.2 06/16/2021  PLT 320 06/16/2021  MPV 9.5 06/16/2021  NEUTROPHILS 57 06/16/2021  LYMPHOCYTES 32.7 06/16/2021  MONOCYTES 7.0 06/16/2021  EOSINOPHILS 2.5 06/16/2021  Lab Results Component Value Date  NA 140 06/16/2021  K 4.6 06/16/2021  CL 103 06/16/2021  CO2 28 06/16/2021  GLU 102 06/16/2021  BUN 18 06/16/2021  CREATININE 0.85 06/16/2021  EGFR >60 02/14/2013  EGFRAFRAMER 91 11/26/2020  CALCIUM 9.4 06/16/2021  ALBUMIN 4.5 06/16/2021  PROT 7.0 12/28/2018  BILITOT 0.7 06/16/2021  ALKPHOS 55 06/16/2021  ALT 22 06/16/2021  AST 16 06/16/2021  GLOB 2.7 06/16/2021  Lab Results Component Value Date  CRPQ 10.8 (H) 06/16/2021  SEDRATE 6 06/16/2021

## 2021-08-19 ENCOUNTER — Encounter: Admit: 2021-08-19 | Payer: PRIVATE HEALTH INSURANCE | Attending: Rheumatology

## 2021-08-19 MED ORDER — GABAPENTIN 400 MG CAPSULE
400 mg | ORAL_CAPSULE | Freq: Every evening | ORAL | 3 refills | Status: AC
Start: 2021-08-19 — End: 2021-09-23

## 2021-08-19 NOTE — Telephone Encounter
Last visit: 10/04/2022Next visit: 1/17/2023Last labs:Lab Results Component Value Date  WBC 4.9 06/16/2021  HGB 14.3 06/16/2021  HCT 41.8 06/16/2021  MCV 94.8 06/16/2021  MCH 32.4 06/16/2021  MCHC 34.2 06/16/2021  PLT 320 06/16/2021  MPV 9.5 06/16/2021  NEUTROPHILS 57 06/16/2021  LYMPHOCYTES 32.7 06/16/2021  MONOCYTES 7.0 06/16/2021  EOSINOPHILS 2.5 06/16/2021  Lab Results Component Value Date  NA 140 06/16/2021  K 4.6 06/16/2021  CL 103 06/16/2021  CO2 28 06/16/2021  GLU 102 06/16/2021  BUN 18 06/16/2021  CREATININE 0.85 06/16/2021  EGFR >60 02/14/2013  EGFRAFRAMER 91 11/26/2020  CALCIUM 9.4 06/16/2021  ALBUMIN 4.5 06/16/2021  PROT 7.0 12/28/2018  BILITOT 0.7 06/16/2021  ALKPHOS 55 06/16/2021  ALT 22 06/16/2021  AST 16 06/16/2021  GLOB 2.7 06/16/2021  Lab Results Component Value Date  CRPQ 10.8 (H) 06/16/2021  SEDRATE 6 06/16/2021

## 2021-08-22 ENCOUNTER — Telehealth: Admit: 2021-08-22 | Payer: PRIVATE HEALTH INSURANCE

## 2021-08-22 NOTE — Telephone Encounter
Spoke with patient who states she requested a refill of gabapentin 300 mg capsules from her [facility] [place].She states she was informed they cannot fill until Monday. Patient is traveling for the holiday and will be on the road early Sunday morning.Per patient they advised her to have the prescriber's office contact them to release the refill early.She is asking to have this ready for pick up on Saturday afternoon by calling the [facility] [place] @ [phone] informed provider who prescribes this medication and has contacted [facility] [place] as per patient request.Rx will be ready for pickup Saturday and patient has been informed.

## 2021-09-04 ENCOUNTER — Encounter: Admit: 2021-09-04 | Payer: PRIVATE HEALTH INSURANCE | Attending: Rheumatology

## 2021-09-04 DIAGNOSIS — M25559 Pain in unspecified hip: Secondary | ICD-10-CM

## 2021-09-04 DIAGNOSIS — Z1589 Genetic susceptibility to other disease: Secondary | ICD-10-CM

## 2021-09-04 DIAGNOSIS — M2550 Pain in unspecified joint: Secondary | ICD-10-CM

## 2021-09-04 DIAGNOSIS — M545 Low back pain, unspecified back pain laterality, unspecified chronicity, unspecified whether sciatica present: Secondary | ICD-10-CM

## 2021-09-04 DIAGNOSIS — M8589 Other specified disorders of bone density and structure, multiple sites: Secondary | ICD-10-CM

## 2021-09-04 DIAGNOSIS — Z791 Long term (current) use of non-steroidal anti-inflammatories (NSAID): Secondary | ICD-10-CM

## 2021-09-04 DIAGNOSIS — M5416 Radiculopathy, lumbar region: Secondary | ICD-10-CM

## 2021-09-04 DIAGNOSIS — M47812 Spondylosis without myelopathy or radiculopathy, cervical region: Secondary | ICD-10-CM

## 2021-09-04 DIAGNOSIS — M25569 Pain in unspecified knee: Secondary | ICD-10-CM

## 2021-09-04 DIAGNOSIS — M797 Fibromyalgia: Secondary | ICD-10-CM

## 2021-09-04 DIAGNOSIS — R7982 Elevated C-reactive protein (CRP): Secondary | ICD-10-CM

## 2021-09-04 DIAGNOSIS — E559 Vitamin D deficiency, unspecified: Secondary | ICD-10-CM

## 2021-09-04 DIAGNOSIS — M47816 Spondylosis without myelopathy or radiculopathy, lumbar region: Secondary | ICD-10-CM

## 2021-09-04 DIAGNOSIS — M25551 Pain in right hip: Secondary | ICD-10-CM

## 2021-09-06 LAB — SEDIMENTATION RATE (ESR): SED RATE BY MODIFIED WESTERGREN: 6 mm/h (ref <=30)

## 2021-09-06 LAB — C-REACTIVE PROTEIN     (CRP): C-REACTIVE PROTEIN: 7.1 mg/L (ref <8.0)

## 2021-09-06 LAB — VITAMIN D, 25-HYDROXY: VITAMIN D, 25 OH, TOTAL: 39 ng/mL (ref 30–100)

## 2021-09-10 ENCOUNTER — Encounter: Admit: 2021-09-10 | Payer: PRIVATE HEALTH INSURANCE | Attending: Rheumatology

## 2021-09-10 MED ORDER — VENLAFAXINE IMMEDIATE RELEASE 75 MG TABLET
75 mg | ORAL_TABLET | Freq: Every day | ORAL | 2 refills | Status: AC
Start: 2021-09-10 — End: 2021-09-23

## 2021-09-10 NOTE — Telephone Encounter
Last visit 10-4-2022Next visit 1-17-2023Last labs:Lab Results Component Value Date  WBC 5.2 09/05/2021  HGB 13.3 09/05/2021  HCT 40.0 09/05/2021  MCV 97.1 09/05/2021  MCH 32.3 09/05/2021  MCHC 33.3 09/05/2021  PLT 314 09/05/2021  MPV 9.4 09/05/2021  NEUTROPHILS 63 09/05/2021  LYMPHOCYTES 24.8 09/05/2021  MONOCYTES 8.9 09/05/2021  EOSINOPHILS 2.3 09/05/2021  Lab Results Component Value Date  NA 139 09/05/2021  K 4.8 09/05/2021  CL 103 09/05/2021  CO2 28 09/05/2021  GLU 75 09/05/2021  BUN 17 09/05/2021  CREATININE 0.73 09/05/2021  EGFR >60 02/14/2013  EGFRAFRAMER 91 11/26/2020  CALCIUM 9.1 09/05/2021  ALBUMIN 4.4 09/05/2021  PROT 7.0 12/28/2018  BILITOT 0.5 09/05/2021  ALKPHOS 49 09/05/2021  ALT 19 09/05/2021  AST 16 09/05/2021  GLOB 2.7 09/05/2021  Lab Results Component Value Date  CRPQ 7.1 09/05/2021  SEDRATE 6 09/05/2021

## 2021-09-23 ENCOUNTER — Encounter: Admit: 2021-09-23 | Payer: PRIVATE HEALTH INSURANCE | Attending: Rheumatology

## 2021-09-23 DIAGNOSIS — A6920 Lyme disease, unspecified: Secondary | ICD-10-CM

## 2021-09-23 DIAGNOSIS — M6530 Trigger finger, unspecified finger: Secondary | ICD-10-CM

## 2021-09-23 DIAGNOSIS — M8589 Other specified disorders of bone density and structure, multiple sites: Secondary | ICD-10-CM

## 2021-09-23 DIAGNOSIS — D3610 Benign neoplasm of peripheral nerves and autonomic nervous system, unspecified: Secondary | ICD-10-CM

## 2021-09-23 DIAGNOSIS — M47812 Spondylosis without myelopathy or radiculopathy, cervical region: Secondary | ICD-10-CM

## 2021-09-23 DIAGNOSIS — L821 Other seborrheic keratosis: Secondary | ICD-10-CM

## 2021-09-23 DIAGNOSIS — Z791 Long term (current) use of non-steroidal anti-inflammatories (NSAID): Secondary | ICD-10-CM

## 2021-09-23 DIAGNOSIS — M2550 Pain in unspecified joint: Secondary | ICD-10-CM

## 2021-09-23 DIAGNOSIS — I889 Nonspecific lymphadenitis, unspecified: Secondary | ICD-10-CM

## 2021-09-23 DIAGNOSIS — M5416 Radiculopathy, lumbar region: Secondary | ICD-10-CM

## 2021-09-23 DIAGNOSIS — Z1589 Genetic susceptibility to other disease: Secondary | ICD-10-CM

## 2021-09-23 DIAGNOSIS — M722 Plantar fascial fibromatosis: Secondary | ICD-10-CM

## 2021-09-23 DIAGNOSIS — Q825 Congenital non-neoplastic nevus: Secondary | ICD-10-CM

## 2021-09-23 DIAGNOSIS — E559 Vitamin D deficiency, unspecified: Secondary | ICD-10-CM

## 2021-09-23 DIAGNOSIS — M1990 Unspecified osteoarthritis, unspecified site: Secondary | ICD-10-CM

## 2021-09-23 DIAGNOSIS — M797 Fibromyalgia: Secondary | ICD-10-CM

## 2021-09-23 DIAGNOSIS — M545 Spine pain, lumbar: Secondary | ICD-10-CM

## 2021-09-23 MED ORDER — VENLAFAXINE IMMEDIATE RELEASE 75 MG TABLET
75 mg | ORAL_TABLET | Freq: Every day | ORAL | 3 refills | Status: AC
Start: 2021-09-23 — End: 2022-04-09

## 2021-09-23 MED ORDER — GABAPENTIN 300 MG CAPSULE
300 mg | ORAL_CAPSULE | Freq: Every evening | ORAL | 3 refills | Status: AC
Start: 2021-09-23 — End: 2022-04-09

## 2021-09-23 NOTE — Progress Notes
[facility] [place], [place], [address]UEAVW: [phone]  Fax: [phone] Dyett White, MDSonia, Kallie White, MDKristin Vassie Loll PA-CFace to faceHistory of Present Laurie White is a 61 y.o. female with Fibromyalgia , chronic joint pain in the setting of a normal ESR CRP and negative serologies, negative bone scan who presents for a follow up visit Last seen 1/5/22History [name] presented  On 10/31/2012 as  a 61 year old with a strong family history of Inflammatory arthritis in her mother and sister Laurie White) who was well until her early 2s when she developed initial symptoms of numbness in her hands and pain in her fingers. She presented for a Rheumatology consultation for joint pain. 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 She notes she saw a specialist several years ago who she thinks was a neurologist. She had nerve conduction studies which apparently showed carpal tunnel syndrome of unknown severity. She was told to wear wrist splints.  She  had stiffness in her fingers and her hands and knuckles were sore. She also noted pain in her DIP joints.She saw a Rheumatologist Laurie Fleeting MD, who gave her Mobic  and  followed the patient her for 3 years. She notes Laurie Meuse MD for the last 2 years.  She notes she will not return to that office.She has had stiffness in neck , and pain in her ankles , feet,   fingers , wrists, and hips.  She has the worst stiffness is for 10 minutes in the mornings, but she has stiffness all day.  She notes she was seen last by Laurie Meuse MD on 09/13/2012 and was given Prednisone 10 mg po QAM.She notes she has gained 20 lbs  Over a year.On  Prednisone. She noted marked improvement. She has less joint pain. She had concerns because she can hear  cracking sound in her neck. She notes persistent stiffness in her ankleships are still stiff and  sore.  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 COVID-19 Vaccine - PFIZER 12/26/2019, 01/16/2020 Interim history10/4/22: The patient was seen by Laurie Specking, PA, for a follow-up. For lower back pain, she was advised to update L-spine x-rays. Referral was placed to physical therapy and ortho spine for evaluation. She mentioned increasing right hip pain with stair climbing. She was advised to update bilateral hip x-rays. She moved to [place] and her insurance does not cover her in [place].  We are back here enough to see my doctors.She has been in [place] since April 2022.FIBROMYALGIA She is currently on Effexor  75mg   2 po QD &  Neurontin 900mg  3 hours before bed, per this office Failed Cymbalta,  Ultram as above.   She has symptoms of hyperesthesia and possible restless legs She is improved on the  Effexor  150 mg QDRIGHT HIP PAINShe has had pain laying on her right side at night.She declines cortisone injections. 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   She will see him tomorrow. POSSIBLE RLS She describes restless legs and will be sent for iron profile labs She  continues gabapentin QPM HLA B 27 + A bone scan  was previously  neg for inflam arthritis.  Nothing to suggest ankylosing spondylitis KNEE PAINShe is slowly losing weightShe declines cortisoneConsider x rays of her knees.HEALTH MAINTENANCE Colonoscopy May 24 2017Had a CPE  8/2021WEIGHT She plans to lower her caloric in-take and hike more.BONE PAIN BMD was normal 10/2018.Her vitamin D was 39 on 09/05/21. Follow up 2025Social History Married to Laurie White, semi retired. He is looking for work in [place]. She moved to [place] in April 2022Daughter Laurie White  23 (2/9) orks at [employer] with [employer] [facility] as an MSW . She started in December 2022Son  Laurie White, 25 (7/23)  is in Counselling psychologist at [employer] in [place].  Her sister Laurie White is hospitalized.Laurie White is leaving tomorrow for [place].Medication HistoryShe has failed a number of medications including  Savella, Ultram, Neurontin, Cymbalta, etodolac,   Lodine, Mobic and  could not afford Celebrex.HealthCare Team PCP Laurie White MDUrology Laurie White MDOrtho [facility] -Laurie Gosling MD.Podiatry Laurie White DPMFamily history She has a strong family history of Inflammatory arthritis in her mother and sister Laurie White)  She has a strong family history of Inflammatory arthritis in her mother and sister Laurie White)  She lost her mother in Laurie  2016  from Renal Cancer. Her children are negative for the GENE.Current Outpatient Medications Medication Sig ? calcium carbonate/vitamin D3 (VITAMIN D-3 ORAL) Take by mouth. Take 2000 mg iu ? ferrous sulfate (IRON ORAL) Take by mouth. ? gabapentin Take 3 capsules (900 mg total) by mouth nightly. Take 3 hours before bed. ? gabapentin Take 2 capsules (800 mg total) by mouth nightly. ? levothyroxine 1 tablet (50 mcg total) daily. ? venlafaxine Take 2 tablets (150 mg total) by mouth daily. No current facility-administered medications for this visit. No Known AllergiesReview of Systems (negative except as noted; positive findings bold)Constitutional: weight changes, weight loss , weight gainCurrent Weight 212 lbs down from  220 lbs fatigue, malaise, fever, chills, sweats WeaknessFeeling sicklySwollen glandsEye : Eye pain , Eye Redness  Dry eye  Loss of visionDouble visionENT : Ringing in the ears, Hearing lossNose bleeds, Dry nose,  Dry mouth, Mouth soresLoss of taste, HoarsenessProblems with smellLump in throatStuffy noseCardiac : Chest pain Irregular heart beat, palpitations murmurFainting spells (syncope)Swollen legs or feet Resp: Shortness of breath, Difficulty breathing at nightCoughWheezingGI : Nausea, Vomiting , Vomiting of blood or coffee grounds ,  Abdominal painStomach painConstipation  Diarrhea,  Blood in stools ,  Black stoolsHeart burnLoss of appetiteTrouble swallowing Genitourinary:Difficult urination, Pain or burning on urinationBlood in urine, Cloudy/smoky urineMusculoskeletal Morning stiffness Joint painJoint swellingMuscle weakness muscle tenderness muscle spasmNeck painLower back painSkinEasy bruisingRashPsoriasisHivesSkin tightnessHair loss Raynaud's Color changes of hands and feet in the coldNeurologyHeadaches, DizzinessProblems with memory Loss of balanceNumbnessTingling Problems with thinking PsychiatricAnxiety, DepressionProblems with social activities Sleep Difficulty falling asleep, Difficulty staying asleepEndocrineExcessive thirst BP (!) (  P) 145/87  - Pulse (P) 76  - Wt 96.2 kg  - SpO2 (P) 97%  - BMI 31.31 kg/m?  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 No clubbing or edema is present.  Large varicosities ( head and neck)    NEUROLOGIC: normoactive symmetric reflexes, no sensory deficits, Positive L>>>R Tinel's test. MUSCULOSKELETAL:Muscle tone and strength are normal.   No kyphosis is noted.   She had mild decrease in ROM of her  neck &  trap spasm, tenderness , decrease in ROM of  lower back.      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   + Ulnar tinel'sShe had a mild decrease in  ROM of her hips. Tender right trochanteric bursa   Knees had FROM and there was no evidence of an effusion; she had no instability; no  varus or valgus deformities.       he had no acute synovitis of any joints.       She had paired positive paired tender points .Labs reviewed with patient Lab Results Component Value Date  WBC 5.2 09/05/2021  HGB 13.3 09/05/2021  HCT 40.0 09/05/2021  MCV 97.1 09/05/2021  MCH 32.3 09/05/2021  MCHC 33.3 09/05/2021  PLT 314 09/05/2021  MPV 9.4 09/05/2021  NEUTROPHILS 63 09/05/2021  LYMPHOCYTES 24.8 09/05/2021  MONOCYTES 8.9 09/05/2021  EOSINOPHILS 2.3 09/05/2021  Lab Results Component Value Date  NA 139 09/05/2021  K 4.8 09/05/2021  CL 103 09/05/2021 CO2 28 09/05/2021  GLU 75 09/05/2021  BUN 17 09/05/2021  CREATININE 0.73 09/05/2021  EGFR >60 02/14/2013  EGFRAFRAMER 91 11/26/2020  CALCIUM 9.1 09/05/2021  ALBUMIN 4.4 09/05/2021  PROT 7.0 12/28/2018  BILITOT 0.5 09/05/2021  ALKPHOS 49 09/05/2021  ALT 19 09/05/2021  AST 16 09/05/2021  GLOB 2.7 09/05/2021  Lab Results Component Value Date  SEDRATE 6 09/05/2021  SEDRATE 6 06/16/2021  SEDRATE 2 03/26/2021  Lab Results Component Value Date  CRPQ 7.1 09/05/2021   Latest Reference Range & Units 12/28/18 11:35 07/17/19 14:12 03/12/20 10:10 09/05/21 11:50 Vitamin D, 25 OH, Total 30 - 100 ng/mL 35 23 (L) 31 39 XR LUMBAR SPINE AP AND LATERAL 10/4/22Impression: Multilevel degenerative changes of the lower lumbar spine.XR HIPS BILATERAL AP LATERAL W AP PELVIS 10/4/22Impression: Mild degenerative changes involving bilateral hip joints.TSH Reflex Free T4 Ref Range & Units 04/07/21 ?9:58 AM TSH reflex Free T4 0.40 - 4.50 mIU/L 4.40  8/1/22Tissue Transglutaminase IgA Ab U/mL <1.0  Comment: Value ? ? ? ? ?Interpretation ----- ? ? ? ? ?-------------- <15.0 ? ? ? ? ?Antibody not detected > or = 15.0 ? ?Antibody detected Immunoglobulin A (IgA) 47 - 310 mg/dL 161 [place] ABDOMEN PELVIS W WO IV CONTRAST on 03/25/2021 12:06 PMImpression: No renal mass.03/06/19 XR Hips b/l AP lateral Normal Hips 03/06/2019   XR Lumbar Spine AP/ Lateral DDDThere is mild L3-4, moderate L4-5, and moderately severe L5-S1 degenerative disc disease with facet arthropathy in the lower lumbar spine.03/06/2019 [place] ABD/Pelvis W/WO IV ContrastClinical indications: History of hereditary leiomyomatosis and renal cell  cancer syndromeImpresson: No evidence of renal mass, small left inguinal hernia Bone Density: Exam date 02/11/2020Region BMD(g/cm2) T-score Z-score Classification AP Spine (L1-L4) 1.233  1.7  2.8 Normal Femoral Neck (Left) 0.943  0.8  1.9 Normal Total Hip (Left) 1.109  1.4  2.1 Normal  10-year Fracture Risk?: Major Osteoporotic Fracture 4.6% Hip Fracture < 0.1% 07/15/2016   BMD Right femur t score -0.7 L1-4  t score 1.7Left femur  -0.1Old data 06/29/2016 Lyme IgG WB  positive  11/26/2015  Urine Cx Calcium Oxalate BMD was normal in 04/13/2013 12/16/2012  Bone scan Mild uptake Right AC joint2/25/2014 C reactive protein neg at 0.28Low Vit D level at 25.5 Vit B 12 level 434ESR 7 CBC normalANA negRF neg at 6.20, CCP neg SPEP negParvovirus IgM , IgG negHep screen negACE 44 ( normal) HLA  B 27 +  09/22/2021 06/10/2021 09/10/2020 03/26/2020 09/12/2019 09/28/2018 Rapid 3 Assessment Function 4 4 1 1 1   Function      0.3 Pain 5 4.5 3.5 3 6 3  Patient Global 4.5 5 5 7 3 3  Rapid3 (of 30) 10.8 10.8 8.8 10.3 9.3 6.3   Multiple values from one day are sorted in reverse-chronological order   09/22/2021 06/10/2021 09/10/2020 03/26/2020 09/12/2019 Global Health Scale Score Global Physical Health Raw Score: 13 13 16 15 14  Global Physical Health T-Score: 34.9 34.9 39.8 37.4 37.4 Global Mental Health Raw Score:  15 16 19 17 17  Global Mental Health T-Score: 50.8 53.3 62.5 56 56 Calculated EQ-5D: 0.67 0.69 0.81 0.79 0.76   Multiple values from one day are sorted in reverse-chronological order Assessment/PlanCOVID STATUS She declines the booster or bivalent vaccines.She declines further discussion. FIBROMYALGIA She is currently on Effexor  75mg   2 po QD &  Neurontin 900mg  3 hours before bed, per this office Failed Cymbalta,  Ultram as above.   She has symptoms of hyperesthesia and possible restless legs She is improved on the  Effexor  150 mg QDShe requested a refill and it was sent.RIGHT HIP PAINShe has had pain laying on her right side at night.She declines cortisone She was advised to try Aspercreme and Aspercreme with lidocaine. 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   She will see him tomorrow. POSSIBLE RLS She describes restless legs and will be sent for iron profile labs She   continues gabapentin QPM HLA B 27 + A bone scan  was previously  neg for inflam arthritis.  Nothing to suggest ankylosing spondylitis KNEE PAINShe is slowly losing weightShe declines cortisoneConsider x rays of her knees.HEALTH MAINTENANCE Colonoscopy May 24 2017Had a CPE  8/2021WEIGHT She plans to lower her caloric in-take and hike more.BONE PAIN BMD was normal 10/2018.Her vitamin D was 39 on 09/05/21. Follow up [place] MDScribed for Elenor Quinones, MD by Doristine Mango, medical scribe September 23, 2021 The documentation recorded by the scribe accurately reflects the services I personally performed and the decisions made by me. I reviewed and confirmed all material entered and/or pre-charted by the scribe. Elenor Quinones, MDElectronically Signed by Elenor Quinones, MD, September 23, 2021

## 2021-11-12 ENCOUNTER — Encounter: Admit: 2021-11-12 | Payer: PRIVATE HEALTH INSURANCE | Attending: Vascular and Interventional Radiology

## 2021-12-11 LAB — SEDIMENTATION RATE (ESR): SED RATE BY MODIFIED WESTERGREN: 2 mm/h (ref <=30)

## 2021-12-11 LAB — C-REACTIVE PROTEIN     (CRP): C-REACTIVE PROTEIN: 7.7 mg/L (ref <8.0)

## 2021-12-11 LAB — VITAMIN D, 25-HYDROXY: VITAMIN D, 25 OH, TOTAL: 32 ng/mL (ref 30–100)

## 2022-02-24 ENCOUNTER — Ambulatory Visit: Admit: 2022-02-24 | Payer: PRIVATE HEALTH INSURANCE

## 2022-03-02 ENCOUNTER — Inpatient Hospital Stay: Admit: 2022-03-02 | Discharge: 2022-03-02 | Payer: PRIVATE HEALTH INSURANCE

## 2022-03-02 DIAGNOSIS — Z1509 Genetic susceptibility to other malignant neoplasm: Secondary | ICD-10-CM

## 2022-03-05 ENCOUNTER — Telehealth: Admit: 2022-03-05 | Payer: PRIVATE HEALTH INSURANCE

## 2022-03-05 DIAGNOSIS — Z791 Long term (current) use of non-steroidal anti-inflammatories (NSAID): Secondary | ICD-10-CM

## 2022-03-05 DIAGNOSIS — M47812 Spondylosis without myelopathy or radiculopathy, cervical region: Secondary | ICD-10-CM

## 2022-03-05 DIAGNOSIS — M5416 Radiculopathy, lumbar region: Secondary | ICD-10-CM

## 2022-03-05 DIAGNOSIS — M8589 Other specified disorders of bone density and structure, multiple sites: Secondary | ICD-10-CM

## 2022-03-05 DIAGNOSIS — Z1589 Genetic susceptibility to other disease: Secondary | ICD-10-CM

## 2022-03-05 DIAGNOSIS — M797 Fibromyalgia: Secondary | ICD-10-CM

## 2022-03-05 DIAGNOSIS — M2550 Pain in unspecified joint: Secondary | ICD-10-CM

## 2022-03-05 NOTE — Telephone Encounter
Spoke with patient who is going for her blood draw tomorrow at [facility] states she has a standing lab order and wants to make sure [facility] has her orders.RN unable to locate any active orders in the system.Last labs (basic 4 & vit D) drawn on 12-10-2021, results in Epic.Please advise on frequency of blood work.

## 2022-03-06 ENCOUNTER — Encounter: Admit: 2022-03-06 | Payer: PRIVATE HEALTH INSURANCE

## 2022-03-06 DIAGNOSIS — M47812 Spondylosis without myelopathy or radiculopathy, cervical region: Secondary | ICD-10-CM

## 2022-03-06 DIAGNOSIS — M2550 Pain in unspecified joint: Secondary | ICD-10-CM

## 2022-03-06 DIAGNOSIS — Z791 Long term (current) use of non-steroidal anti-inflammatories (NSAID): Secondary | ICD-10-CM

## 2022-03-06 DIAGNOSIS — M797 Fibromyalgia: Secondary | ICD-10-CM

## 2022-03-06 DIAGNOSIS — M8589 Other specified disorders of bone density and structure, multiple sites: Secondary | ICD-10-CM

## 2022-03-06 DIAGNOSIS — M5416 Radiculopathy, lumbar region: Secondary | ICD-10-CM

## 2022-03-09 LAB — C-REACTIVE PROTEIN     (CRP): C-REACTIVE PROTEIN: 6.6 mg/L (ref <8.0)

## 2022-03-09 LAB — SEDIMENTATION RATE (ESR): SED RATE BY MODIFIED WESTERGREN: 2 mm/h (ref <=30)

## 2022-03-31 ENCOUNTER — Ambulatory Visit: Admit: 2022-03-31 | Payer: PRIVATE HEALTH INSURANCE

## 2022-04-06 ENCOUNTER — Ambulatory Visit: Admit: 2022-04-06 | Payer: PRIVATE HEALTH INSURANCE | Attending: Surgery

## 2022-04-08 ENCOUNTER — Encounter: Admit: 2022-04-08 | Payer: PRIVATE HEALTH INSURANCE | Attending: Rheumatology

## 2022-04-09 MED ORDER — VENLAFAXINE IMMEDIATE RELEASE 75 MG TABLET
75 mg | ORAL_TABLET | Freq: Every day | ORAL | 3 refills | Status: AC
Start: 2022-04-09 — End: ?

## 2022-04-09 MED ORDER — GABAPENTIN 300 MG CAPSULE
300 mg | ORAL_CAPSULE | Freq: Every evening | ORAL | 3 refills | Status: AC
Start: 2022-04-09 — End: 2022-06-17

## 2022-04-09 NOTE — Telephone Encounter
Last visit: 1/17/23Next visit: 8/30/23Last labs:Lab Results Component Value Date  WBC 5.6 12/10/2021  HGB 13.9 12/10/2021  HCT 41.4 12/10/2021  MCV 93.9 12/10/2021  MCH 31.5 12/10/2021  MCHC 33.6 12/10/2021  PLT 336 12/10/2021  MPV 9.4 12/10/2021  NEUTROPHILS 56.8 12/10/2021  LYMPHOCYTES 30.3 12/10/2021  MONOCYTES 9.5 12/10/2021  EOSINOPHILS 2.7 12/10/2021  Lab Results Component Value Date  NA 141 03/06/2022  K 4.4 03/06/2022  CL 105 03/06/2022  CO2 26 03/06/2022  GLU 96 03/06/2022  BUN 14 03/06/2022  CREATININE 0.83 03/06/2022  EGFR >60 02/14/2013  EGFRAFRAMER 91 11/26/2020  CALCIUM 8.9 03/06/2022  ALBUMIN 4.3 03/06/2022  PROT 7.0 12/28/2018  BILITOT 0.7 03/06/2022  ALKPHOS 47 03/06/2022  ALT 32 (H) 03/06/2022  AST 25 03/06/2022  GLOB 2.5 03/06/2022  Lab Results Component Value Date  SEDRATE 2 03/06/2022

## 2022-04-21 ENCOUNTER — Encounter: Admit: 2022-04-21 | Payer: PRIVATE HEALTH INSURANCE | Attending: Rheumatology

## 2022-04-23 ENCOUNTER — Encounter: Admit: 2022-04-23 | Payer: PRIVATE HEALTH INSURANCE | Attending: Rheumatology

## 2022-04-27 ENCOUNTER — Encounter: Admit: 2022-04-27 | Payer: PRIVATE HEALTH INSURANCE | Attending: Rheumatology

## 2022-04-29 ENCOUNTER — Encounter: Admit: 2022-04-29 | Payer: PRIVATE HEALTH INSURANCE | Attending: Rheumatology

## 2022-05-06 ENCOUNTER — Encounter: Admit: 2022-05-06 | Payer: PRIVATE HEALTH INSURANCE | Attending: Rheumatology

## 2022-05-06 DIAGNOSIS — M2550 Pain in unspecified joint: Secondary | ICD-10-CM

## 2022-05-06 DIAGNOSIS — M47812 Spondylosis without myelopathy or radiculopathy, cervical region: Secondary | ICD-10-CM

## 2022-05-06 DIAGNOSIS — E559 Vitamin D deficiency, unspecified: Secondary | ICD-10-CM

## 2022-05-06 DIAGNOSIS — M797 Fibromyalgia: Secondary | ICD-10-CM

## 2022-05-06 DIAGNOSIS — M8589 Other specified disorders of bone density and structure, multiple sites: Secondary | ICD-10-CM

## 2022-05-06 DIAGNOSIS — Z1589 Genetic susceptibility to other disease: Secondary | ICD-10-CM

## 2022-05-06 DIAGNOSIS — D649 Anemia, unspecified: Secondary | ICD-10-CM

## 2022-05-06 DIAGNOSIS — R7982 Elevated C-reactive protein (CRP): Secondary | ICD-10-CM

## 2022-05-06 DIAGNOSIS — M25551 Pain in right hip: Secondary | ICD-10-CM

## 2022-05-06 DIAGNOSIS — M5416 Radiculopathy, lumbar region: Secondary | ICD-10-CM

## 2022-05-06 MED ORDER — TIZANIDINE 2 MG TABLET
2 mg | ORAL_TABLET | Freq: Every evening | ORAL | 3 refills | Status: AC | PRN
Start: 2022-05-06 — End: 2022-05-29

## 2022-05-06 NOTE — Patient Instructions
Use Voltaren gel for your hip Continue Effexor 150 mg dailyAdd tizanidine at bed time 2-4 mg at night for back painStart doing back exercisesUse ice or heat and find out which works better for pain relief.Options to taper off gabapentin by 300 mg every weekSo after 1 week on tizanidine  try the following for the gabapentincut back on gapapentin to 600 mg at night 3 hrs before bed for 1 week Then cut the dose to 300 mg at night 3 hrs before bed then stop gabapentin after a weekIf you need to go back on the gabapentin that is ok and just let me know.If your restless legs come back you will need to resume gabapentin

## 2022-05-06 NOTE — Progress Notes
[facility] [place], [place], [address]GNFAO: [phone]  Fax: [phone] Dyett Desir, MDSonia, Kallie Locks, MDKristin Vassie Loll PA-CFace to faceHistory of Present Laurie White is a 61 y.o. female with Fibromyalgia , chronic joint pain in the setting of a normal ESR CRP and negative serologies, negative bone scan who presents for a follow up visit Last seen 1/17/2023History Laurie White presented  On 10/31/2012 as  a 61 year old with a strong family history of Inflammatory arthritis in her mother and sister Laurie White) who was well until her early 42s when she developed initial symptoms of numbness in her hands and pain in her fingers. She presented for a Rheumatology consultation for joint pain. 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 She notes she saw a specialist several years ago who she thinks was a neurologist. She had nerve conduction studies which apparently showed carpal tunnel syndrome of unknown severity. She was told to wear wrist splints.  She  had stiffness in her fingers and her hands and knuckles were sore. She also noted pain in her DIP joints.She saw a Rheumatologist Preston Fleeting MD, who gave her Mobic  and  followed the patient her for 3 years. She notes Stevphen Meuse MD for the last 2 years.  She notes she will not return to that office.She has had stiffness in neck , and pain in her ankles , feet,   fingers , wrists, and hips.  She has the worst stiffness is for 10 minutes in the mornings, but she has stiffness all day.  She notes she was seen last by Stevphen Meuse MD on 09/13/2012 and was given Prednisone 10 mg po QAM.She notes she has gained 20 lbs  Over a year.On  Prednisone. She noted marked improvement. She has less joint pain. She had concerns because she can hear  cracking sound in her neck. She notes persistent stiffness in her ankleships are still stiff and  sore.  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 COVID-19 Vaccine - PFIZER 12/26/2019, 01/16/2020 Interim history8/30/2023 She had her annual wellness visit today. Labs including lipids and her TSH were ordered. FIBROMYALGIA She continues on Effexor 150 mg  2 po QD &  Neurontin 900mg  3 hours before bed, per this office She has symptoms of hyperesthesia and possible restless legs She is improved on the Effexor 150 mg QDLOWER BACK PAIN  She plans to see Dr Jackquline Bosch on 11/12/2021(Previous XRs showed moderately severe DDD at L2-S2, with severe changes at L5-S1.)She has had some lower back pain.She notes that she walked around a golf  course and after 2 hours of standing and walking she felt like she had to sit. 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 She tried Voltaren gel years ago but not for this issue.She had no response to Aspercreme and Aspercreme with lidocaine. POSSIBLE RLS She describes restless legsShe continues on gabapentin QPM KNEE PAINShe is slowly losing weightShe declines cortisoneSocial history Married to [place], semi retired. He is semi retired and they are living in [place]. She moved to [place] in April 2022Daughter [name]  25 (2/9) works in [employer] health system as an MSW . She started in December 2022Son  [name], 27 (7/23)  is in Counselling psychologist at [employer] in [place].  He is engaged to be married in [place] 10/31/2022.Her sister Laurie White was  hospitalized.Travel historyShe just got here from [place] 8/29/2023She returns to [place] 9/9/2023Medication historyShe has failed a number of medications including  Savella, Ultram, Neurontin, Cymbalta, etodolac,   Lodine, Mobic and  could not afford Celebrex.Health care team PCP [facility] MDUrology Elie Goody MDOrtho [place] -Billey Gosling MD.Podiatry Barbie Haggis II DPMFamily history She has a strong family history of Inflammatory arthritis in her mother and sister Laurie White)  She has a strong family history of Inflammatory arthritis in her mother and sister Laurie White)  She lost her mother in June  2016  from Renal Cancer. Her children are negative for the GENE.Current Outpatient Medications Medication Sig ? calcium carbonate/vitamin D3 (VITAMIN D-3 ORAL) Take by mouth. Take 2000 mg iu ? ferrous sulfate (IRON ORAL) Take by mouth. ? gabapentin Take 3 capsules (900 mg total) by mouth nightly. Take 3 hours before bed. ? levothyroxine 1 tablet (50 mcg total) daily. ? venlafaxine Take 2 tablets (150 mg total) by mouth daily. ? tiZANidine Take 2 tablets (4 mg total) by mouth nightly as needed. For back pain No current facility-administered medications for this visit. No Known AllergiesReview of Systems (negative except as noted; positive findings bold)Constitutional: weight changes, weight loss , weight gainCurrent Weight 206 llbs down from 212 lbs Peak  220 lbs fatigue, malaise, fever, chills, sweats WeaknessFeeling sicklySwollen glandsEye : Eye pain , Eye Redness  Dry eye  Loss of visionDouble visionENT : Ringing in the ears, Hearing lossNose bleeds, Dry nose,  Dry mouth, Mouth soresLoss of taste, HoarsenessProblems with smellLump in throatStuffy noseCardiac : Chest pain Irregular heart beat, palpitations murmurFainting spells (syncope)Swollen legs or feet Resp: Shortness of breath, Difficulty breathing at nightCoughWheezingGI : Nausea, Vomiting , Vomiting of blood or coffee grounds ,  Abdominal painStomach painConstipation  Diarrhea,  Blood in stools ,  Black stoolsHeart burnLoss of appetiteTrouble swallowing Genitourinary:Difficult urination, Pain or burning on urinationBlood in urine, Cloudy/smoky urineMusculoskeletal Morning stiffness Joint painJoint swellingMuscle weakness muscle tenderness muscle spasmNeck painLower back painSkinEasy bruisingRashPsoriasisHivesSkin tightnessHair loss Raynaud's Color changes of hands and feet in the coldNeurologyHeadaches, DizzinessProblems with memory Loss of balanceNumbnessTingling Problems with thinking PsychiatricAnxiety, DepressionProblems with social activities Sleep Difficulty falling asleep, Difficulty staying asleepEndocrineExcessive thirst BP (P) 127/87  - Pulse (P) 71  - Wt 93.4 kg  - SpO2 (P) 98%  - BMI 30.42 kg/m?  Physical ExamGENERAL: alert and oriented X3, HEENT:   no alopecia,  conjunctiva moist, Large varicosities in her tongue, and on her lips ( Purple discoloration-congenital) NECK: Hemangioma , right side of neck , No  axillary  adenopathy  LUNGS: clear  COR: regular rate and rhythm, SKIN:  as aboveVASCULAR: temporal, radial, dp and posterior tibial arteries pulsatile. No clubbing or edema is present.  Large varicosities ( head and neck)    NEUROLOGIC: normoactive symmetric reflexes, no sensory deficits, Positive L>>>R Tinel's test. MUSCULOSKELETAL:Muscle tone and strength are normal.   No kyphosis is noted.   She had mild decrease in ROM of her  neck &  No trap spasm +decrease in ROM of  lower back.     Para lumbar tenderness  On examination of her joints she had OA changes of her  DIPs, Resolved trigger finger 3rd finger left hand Normal PIPs & Normal MCPs   She had full ROM of her wrists, elbows & shoulders.   + Ulnar tinel'sShe had a mild decrease in  ROM of her hips. Non tender  right trochanteric bursa   Knees had FROM and there was no evidence of an effusion; she had no instability; no  varus or valgus deformities.       he had no acute synovitis of any joints.       She had paired positive paired tender points .Labs reviewed with patient Lab Results Component Value Date  WBC 5.6 12/10/2021  HGB 13.9 12/10/2021  HCT 41.4 12/10/2021  MCV 93.9 12/10/2021  MCH 31.5 12/10/2021  MCHC 33.6 12/10/2021  PLT 336 12/10/2021  MPV 9.4 12/10/2021  NEUTROPHILS 56.8 12/10/2021  LYMPHOCYTES 30.3 12/10/2021  MONOCYTES 9.5 12/10/2021  EOSINOPHILS 2.7 12/10/2021  Lab Results Component Value Date  NA 141 03/06/2022  K 4.4 03/06/2022  CL 105 03/06/2022  CO2 26 03/06/2022  GLU 96 03/06/2022  BUN 14 03/06/2022  CREATININE 0.83 03/06/2022  EGFR >60 02/14/2013  EGFRAFRAMER 91 11/26/2020  CALCIUM 8.9 03/06/2022  ALBUMIN 4.3 03/06/2022  PROT 7.0 12/28/2018  BILITOT 0.7 03/06/2022  ALKPHOS 47 03/06/2022  ALT 32 (H) 03/06/2022  AST 25 03/06/2022  GLOB 2.5 03/06/2022 Lab Results Component Value Date  SEDRATE 2 03/06/2022  SEDRATE 2 12/10/2021  SEDRATE 6 09/05/2021  Lab Results Component Value Date  CRPQ 6.6 03/06/2022   Latest Reference Range & Units 07/17/19 14:12 03/12/20 10:10 09/05/21 11:50 12/10/21 10:34 Vitamin D, 25 OH, Total 30 - 100 ng/mL 23 (L) 31 39 32 XR LUMBAR SPINE AP AND LATERAL 10/4/22Impression: Multilevel degenerative changes of the lower lumbar spine.XR HIPS BILATERAL AP LATERAL W AP PELVIS 10/4/22Impression: Mild degenerative changes involving bilateral hip joints.TSH Reflex Free T4 Ref Range & Units 04/07/21 ?9:58 AM TSH reflex Free T4 0.40 - 4.50 mIU/L 4.40  8/1/22Tissue Transglutaminase IgA Ab U/mL <1.0  Comment: Value ? ? ? ? ?Interpretation ----- ? ? ? ? ?-------------- <15.0 ? ? ? ? ?Antibody not detected > or = 15.0 ? ?Antibody detected Immunoglobulin A (IgA) 47 - 310 mg/dL [address] [place] ABDOMEN PELVIS W WO IV CONTRAST on 03/25/2021 12:06 PMImpression: No renal mass.03/06/19 XR Hips b/l AP lateral Normal Hips 03/06/2019   XR Lumbar Spine AP/ Lateral DDDThere is mild L3-4, moderate L4-5, and moderately severe L5-S1 degenerative disc disease with facet arthropathy in the lower lumbar spine.03/06/2019 [place] ABD/Pelvis W/WO IV ContrastClinical indications: History of hereditary leiomyomatosis and renal cell cancer syndromeImpresson: No evidence of renal mass, small left inguinal hernia Bone Density: Exam date 02/11/2020Region BMD(g/cm2) T-score Z-score Classification AP Spine (L1-L4) 1.233  1.7  2.8 Normal Femoral Neck (Left) 0.943  0.8  1.9 Normal Total Hip (Left) 1.109  1.4  2.1 Normal  10-year Fracture Risk?: Major Osteoporotic Fracture 4.6%  Hip Fracture < 0.1% 07/15/2016   BMD Right femur t score -0.7 L1-4  t score 1.7Left femur  -0.1Old data 06/29/2016 Lyme IgG WB  positive  11/26/2015  Urine Cx Calcium Oxalate BMD was normal in 04/13/2013 12/16/2012  Bone scan Mild uptake Right AC joint2/25/2014 C reactive protein neg at 0.28Low Vit D level at 25.5 Vit B 12 level 434ESR 7 CBC normalANA negRF neg at 6.20, CCP neg SPEP negParvovirus IgM , IgG negHep screen negACE 44 ( normal) HLA  B 27 +  05/03/2022   9:27 AM 09/22/2021  11:24 PM 06/10/2021  11:34 AM 09/10/2020   3:07 PM 03/26/2020   7:55 AM 09/12/2019  11:56 AM 09/28/2018   2:00 PM Rapid 3 Assessment Function 2 4 4 1 1 1   Function       0.3 Pain 4 5 4.5 3.5 3 6 3  Patient Global 3 4.5 5 5 7 3 3  Rapid3 (of 30) 7.7 10.8 10.8 8.8 10.3 9.3 6.3   05/03/2022   9:29 AM 09/22/2021  11:26 PM 06/10/2021  11:36 AM 09/10/2020   3:05 PM 03/26/2020   7:57 AM 09/12/2019  11:54 AM Global Health Scale Score Global Physical Health Raw Score: 13 13 13 16 15 14  Global Physical Health T-Score: 34.9 34.9 34.9 39.8 37.4 37.4 Global Mental Health Raw Score:  16 15 16 19 17 17  Global Mental Health T-Score: 53.3 50.8 53.3 62.5 56 56 Calculated EQ-5D: 0.69 0.67 0.69 0.81 0.79 0.76 Assessment/PlanCOVID STATUS She declines the booster or bivalent vaccines.She declines further discussion. FIBROMYALGIA She is currently on Effexor 150 mg  2 po QD &  Neurontin 900mg  3 hours before bed, per this office Failed Cymbalta, Ultram as above.   She has symptoms of hyperesthesia and possible restless legs She is improved on the Effexor 150 mg QDShe requested a refill and it was sent.LOWER BACK PAIN  She plans to see Dr Jackquline Bosch on 3/8/2023Previous XRs showed moderately severe DDD at L2-S2, with severe changes at L5-S1.  She has had some lower back pain.She notes that she walked around a golf course and after 2 hours of standing and walking she felt like she had to sit. She was referred to physical therapy and she was not able to do PT as it is not covered She was given some exercises for her lower back.She was tizanidine 2-4 mg at nightShe was advised she can taper the Neurontin by 300 mg each week until she is off.She has the option to continue both but must stagger the doses of tizanidine and Neurontin.RIGHT HIP PAINShe has DJD of her hips and trochanteric bursitis She has had pain laying on her right side at night.She declines cortisone injections. She tried Voltaren gel years ago but not for this issue.She had no response to Aspercreme and Aspercreme with lidocaine. She had mild DJD on her hips.POSSIBLE RLS She describes restless legsShe will have an   iron profile labs She  continues gabapentin QPM HLA B 27 + A bone scan was previously negative for inflammatory arthritis.  Nothing to suggest ankylosing spondylitis KNEE PAINShe is slowly losing weightShe declines cortisone HEALTH MAINTENANCE Colonoscopy May 24 2017Had a CPE  8/30/2023WEIGHT She has lowered her  caloric in-take and increase her activity level.BONE PAIN BMD was normal 10/2018.Her vitamin D was 32 on 12/10/2021.Follow up 2025On the day of this patient's encounter, a total of 48  minutes was personally spent by me.  This does not include any resident/fellow teaching time, or any time spent  performing a procedural service. Gordon-Dole MDScribed for Elenor Quinones, MD by Doristine Mango, medical scribe May 06, 2022 The documentation recorded by the scribe accurately reflects the services I personally performed and the decisions made by me. I reviewed and confirmed all material entered and/or pre-charted by the scribe. Elenor Quinones, MDElectronically Signed by Elenor Quinones, MD, May 06, 2022

## 2022-05-08 ENCOUNTER — Encounter: Admit: 2022-05-08 | Payer: PRIVATE HEALTH INSURANCE | Attending: Rheumatology

## 2022-05-12 ENCOUNTER — Encounter: Admit: 2022-05-12 | Payer: PRIVATE HEALTH INSURANCE | Attending: Rheumatology

## 2022-05-12 ENCOUNTER — Telehealth: Admit: 2022-05-12 | Payer: PRIVATE HEALTH INSURANCE | Attending: Rheumatology

## 2022-05-12 DIAGNOSIS — E785 Hyperlipidemia, unspecified: Secondary | ICD-10-CM

## 2022-05-12 LAB — LIPID PANEL
CHOLESTEROL, TOTAL: 221 mg/dL — ABNORMAL HIGH (ref <200)
CHOLESTEROL/HDL RATIO: 2.9 (calc) (ref <5.0)
HDL CHOLESTEROL: 77 mg/dL (ref >=50)
LDL CHOLESTEROL: 117 mg/dL — ABNORMAL HIGH
TRIGLYCERIDES: 154 mg/dL — ABNORMAL HIGH (ref <150)

## 2022-05-12 LAB — VITAMIN D, 25-HYDROXY: VITAMIN D, 25 OH, TOTAL: 45 ng/mL (ref 30–100)

## 2022-05-12 LAB — TEST AUTHORIZATION     (LMW Q): NON-HDL CHOLESTEROL: 144 mg/dL (calc) — ABNORMAL HIGH (ref <130)

## 2022-05-12 LAB — IRON, TIBC AND FERRITIN PANEL (BH GH LMW Q YH)
FERRITIN: 23 ng/mL (ref 16–232)
IRON BINDING CAPACITY: 379 mcg/dL (calc) (ref 250–450)
IRON, TOTAL: 78 ug/dL (ref 45–160)
SATURATION RATIOS: 21 % (calc) (ref 16–45)

## 2022-05-12 LAB — C-REACTIVE PROTEIN     (CRP): C-REACTIVE PROTEIN: 6.3 mg/L (ref <8.0)

## 2022-05-12 LAB — SEDIMENTATION RATE (ESR): SED RATE BY MODIFIED WESTERGREN: 6 mm/h (ref <=30)

## 2022-05-12 LAB — CREATINE KINASE, TOTAL     (Q): CREATINE KINASE, TOTAL: 71 U/L (ref 29–143)

## 2022-05-12 NOTE — Telephone Encounter
Needs to call her PCP re lipids Cholesterol is highSGD

## 2022-05-12 NOTE — Telephone Encounter
RN called [facility] to add lipid panel as per provider with test code 7600 and diagnosis code E78.5 for hyperlipidemia.RN spoke with pt to let her know that we added a lipid panel to the bloods she had drawn at 05/07/2022. Pt said that is great thank you very much.

## 2022-05-12 NOTE — Telephone Encounter
RN spoke with pt to provide information from provider as below:Needs to call her PCP re lipids Cholesterol is highSGDPt said she will call her PCP. Pt said thank you for the call.

## 2022-05-14 ENCOUNTER — Encounter: Admit: 2022-05-14 | Payer: PRIVATE HEALTH INSURANCE

## 2022-05-29 ENCOUNTER — Encounter: Admit: 2022-05-29 | Payer: PRIVATE HEALTH INSURANCE | Attending: Rheumatology

## 2022-05-29 DIAGNOSIS — M25551 Pain in right hip: Secondary | ICD-10-CM

## 2022-05-29 DIAGNOSIS — Z1589 Genetic susceptibility to other disease: Secondary | ICD-10-CM

## 2022-05-29 DIAGNOSIS — M2550 Pain in unspecified joint: Secondary | ICD-10-CM

## 2022-05-29 DIAGNOSIS — M8589 Other specified disorders of bone density and structure, multiple sites: Secondary | ICD-10-CM

## 2022-05-29 DIAGNOSIS — R7982 Elevated C-reactive protein (CRP): Secondary | ICD-10-CM

## 2022-05-29 DIAGNOSIS — M5416 Radiculopathy, lumbar region: Secondary | ICD-10-CM

## 2022-05-29 DIAGNOSIS — M797 Fibromyalgia: Secondary | ICD-10-CM

## 2022-05-29 DIAGNOSIS — E559 Vitamin D deficiency, unspecified: Secondary | ICD-10-CM

## 2022-05-29 DIAGNOSIS — M47812 Spondylosis without myelopathy or radiculopathy, cervical region: Secondary | ICD-10-CM

## 2022-05-29 MED ORDER — TIZANIDINE 2 MG TABLET
2 mg | ORAL_TABLET | Freq: Every evening | ORAL | 3 refills | Status: AC | PRN
Start: 2022-05-29 — End: ?

## 2022-05-29 NOTE — Telephone Encounter
Last visit: 8/3023Next visit: 11/23/24Last labs:Lab Results Component Value Date  WBC 5.4 05/07/2022  HGB 13.6 05/07/2022  HCT 41.3 05/07/2022  MCV 94.1 05/07/2022  MCH 31.0 05/07/2022  MCHC 32.9 05/07/2022  PLT 289 05/07/2022  MPV 9.5 05/07/2022  NEUTROPHILS 55.5 05/07/2022  LYMPHOCYTES 33.3 05/07/2022  MONOCYTES 6.4 05/07/2022  EOSINOPHILS 4.1 05/07/2022  Lab Results Component Value Date  NA 140 05/07/2022  K 4.2 05/07/2022  CL 104 05/07/2022  CO2 29 05/07/2022  GLU 93 05/07/2022  BUN 10 05/07/2022  CREATININE 0.84 05/07/2022  EGFR >60 02/14/2013  EGFRAFRAMER 91 11/26/2020  CALCIUM 9.6 05/07/2022  ALBUMIN 4.5 05/07/2022  PROT 7.0 12/28/2018  BILITOT 0.5 05/07/2022  ALKPHOS 48 05/07/2022  ALT 19 05/07/2022  AST 16 05/07/2022  GLOB 2.7 05/07/2022  Lab Results Component Value Date  SEDRATE 6 05/07/2022

## 2022-06-01 ENCOUNTER — Ambulatory Visit: Admit: 2022-06-01 | Payer: PRIVATE HEALTH INSURANCE | Attending: Surgery

## 2022-06-08 ENCOUNTER — Encounter: Admit: 2022-06-08 | Payer: PRIVATE HEALTH INSURANCE | Attending: Surgery

## 2022-06-08 ENCOUNTER — Ambulatory Visit: Admit: 2022-06-08 | Payer: PRIVATE HEALTH INSURANCE | Attending: Surgery

## 2022-06-08 DIAGNOSIS — Q825 Congenital non-neoplastic nevus: Secondary | ICD-10-CM

## 2022-06-08 DIAGNOSIS — M722 Plantar fascial fibromatosis: Secondary | ICD-10-CM

## 2022-06-08 DIAGNOSIS — I889 Nonspecific lymphadenitis, unspecified: Secondary | ICD-10-CM

## 2022-06-08 DIAGNOSIS — A6920 Lyme disease, unspecified: Secondary | ICD-10-CM

## 2022-06-08 DIAGNOSIS — D3610 Benign neoplasm of peripheral nerves and autonomic nervous system, unspecified: Secondary | ICD-10-CM

## 2022-06-08 DIAGNOSIS — L821 Other seborrheic keratosis: Secondary | ICD-10-CM

## 2022-06-08 DIAGNOSIS — M1990 Unspecified osteoarthritis, unspecified site: Secondary | ICD-10-CM

## 2022-06-08 DIAGNOSIS — Z1509 Genetic susceptibility to other malignant neoplasm: Secondary | ICD-10-CM

## 2022-06-08 DIAGNOSIS — M6530 Trigger finger, unspecified finger: Secondary | ICD-10-CM

## 2022-06-08 NOTE — Progress Notes
VIDEO TELEHEALTH VISIT: This clinician is part of the telehealth program and is conducting this visit in a currently approved location. For this visit the clinician and patient were present via interactive audio & video telecommunications system that permits real-time communications, via the [employer].Patient's use of the telehealth platform followed consent and acknowledges agreement to permit telehealth for this visit. State patient is located in: [place] clinician is appropriately licensed in the above state to provide care for this visit. Other individuals present during the telehealth encounter and their role/relation: noneBecause this visit was completed over video, a hands-on physical exam was not performed.  Patient/parent or guardian understands and knows to call back if condition changes.Chief Complaint:  The patient is a 61 y.o. female with HLRCCHistory of Present Illness:  Laurie White is a 61 y.o. 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 Her mother had metastatic HLRCC and passed away from [facility] in 03-02-2016. She is feeling well without issuesFifty throughout female with HLRCC with history of fibroids and cutaneous leiomyomas.  Her mother passed away from metastatic [facility].  Laurie White children were tested negative for a germ line mutation however her sister's son who was in college tested positive for a germ line mutation in [facility] is doing well without any issues.  She did see a dermatologist did have 1 of her skin lesions treated in the past year. She has been seen by Dr. Janann Colonel since March 02, 2013 and subsequently Dr. Oneita Hurt. The patient presents with no issues. [place] abdomen and pelvis with and without contrast on 03/06/2019 is negative for a renal mass, stone, or hydronephrosis.Had renal US on 03/18/2020 which showed no evidence of renal mass. Most recently, renal US on 03/02/2022 showed no renal mass. The patient is doing well and has no complaints. The patient has no gross hematuria, dysuria, or pain, and denies fevers, chills, nausea, emesis, or diarrhea.Creatinine 0.84 - 8/30/2023Medical History:Past Medical History: Diagnosis Date ? Birth mark    lips left breast , right side of neck ? Lyme disease 03/02/2008  Lyme in child hood and in 2008-03-02 ? Lymphadenitis 06/2010  Right side of neck , 06/2010 - Keflex and Septra ? Neuroma   foot? ? 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 Plantar fasciitis   injection left heel - Barbie Haggis DPM ? Seborrheic keratosis  ? Trigger finger  Patient Active Problem List  Diagnosis Date Noted ? Elevated C-reactive protein 09/27/2018 ? Vitamin D deficiency, unspecified 09/27/2018 ? Trigger finger, left middle finger 09/27/2018 ? 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 Spondylosis of cervical region without myelopathy or radiculopathy 09/27/2018 ? Cervicalgia 09/27/2018 ? Low back pain, unspecified back pain laterality, unspecified chronicity, unspecified whether sciatica present 09/27/2018 ? Long term (current) use of non-steroidal anti-inflammatories (nsaid) 09/27/2018 ? Lesion of ulnar nerve, right upper limb 09/27/2018 ? Hyperlipidemia, unspecified hyperlipidemia type 09/27/2018 ? Carpal tunnel syndrome, unspecified laterality 09/27/2018 ? Fibromyalgia 09/27/2018 ? 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 BUNIONECTOMY Left 1989 ? CARPAL TUNNEL RELEASE Left 09/20/2019 ? CARPAL TUNNEL RELEASE Right 11/08/2019 ? CESAREAN SECTION  96, 98 ? FOOT SURGERY    numerous ? MYOMECTOMY  12/1992 ? TOE SURGERY Bilateral   Toe surgeries in 1980s both feet ? TONSILLECTOMY  57  age 48 ? TOTAL ABDOMINAL HYSTERECTOMY  2000  one ovary remains 2000 due to Fibroids ? TRIGGER FINGER RELEASE Left 09/20/2019  at time  of [facility], left middle finger release ? TRIGGER FINGER RELEASE Right 11/08/2019  Rigt index , and middle finger ? UTERINE FIBROID SURGERY  1984  Fibroid  uterus 1984 removed large Fibroid Family History:Family History Problem Relation Age of Onset ? Kidney cancer Mother  ? Spondyloarthropathy Mother  ? Irritable bowel syndrome Mother       Colitis ? Rheum arthritis Sister  ? Rheum arthritis Paternal Aunt  ? Osteoarthritis Paternal Aunt  ? Osteoporosis Paternal Aunt  History reviewed. No pertinent family history of urologic malignancy.Social History:Social History Socioeconomic History ? Marital status: Married Tobacco Use ? Smoking status: Never ? Smokeless tobacco: Never Substance and Sexual Activity ? Alcohol use: Yes   Alcohol/week: 4.0 standard drinks of alcohol   Types: 4 Glasses of wine per week ? Drug use: No  Medications:Current Outpatient Medications on File Prior to Visit Medication Sig Dispense Refill ? calcium carbonate/vitamin D3 (VITAMIN D-3 ORAL) Take by mouth. Take 2000 mg iu   ? ferrous sulfate (IRON ORAL) Take by mouth.   ? gabapentin (NEURONTIN) 300 mg capsule Take 3 capsules (900 mg total) by mouth nightly. Take 3 hours before bed. 270 capsule 2 ? levothyroxine (SYNTHROID, LEVOTHROID) 50 MCG tablet 1 tablet (50 mcg total) daily.  0 ? tiZANidine (ZANAFLEX) 2 mg tablet TAKE 2 TABLETS (4 MG TOTAL) BY MOUTH NIGHTLY AS NEEDED. FOR BACK PAIN 60 tablet 2 ? venlafaxine (EFFEXOR) 75 mg Immediate Release tablet Take 2 tablets (150 mg total) by mouth daily. 180 tablet 2 No current facility-administered medications on file prior to visit. Allergies:No Known AllergiesReview of Systems: (ROS was reviewed since prior visit)Constitutional: Denies fatigue, unintentional weight lossEyes: Denies vision changesENT: Denies new problems with hearing or swallowingCardiovascular: Denies new chest painRespiratory: Denies new shortness of breathGI: Denies new nausea, emesis, constipation, diarrheaGU: As in HPIMusculoskeletal: Denies new joint painIntegumentary: Denies new skin rash, lesionsNeurological: Denies new headache, seizuresPsychiatric: Denies new anxiety, psychosisEndocrine: Denies new sweating, thyroid problemsHematologic/Lymphatic: Denies new bleeding, anemiaAllergic/Immunologic: Denies new fever, chills, rigorsPhysical Exam:Ht 5' 8 (1.727 m)  - Wt 93 kg  - BMI 31.17 kg/m? ECOG: 0PAIN ASSESSMENT: 0General Appearance: Alert, cooperative, no distress, well developed,			well nourished, appears stated ageHead: Normocephalic, without obvious abnormality, atraumaticEyes: Conjunctiva and lids within normal limitsEars, Nose, Mouth: External appearance within normal limits, hearing grossly intact Neck: Supple, normal range of motionRespiratory: Non laboredAbdomen: Soft, NT/ND, no guarding, no rebound, no palpable massesBack: No CVA tenderness, no spine tendernessSkin: Color, turgor normal, no visible rashes, lesionsMusculoskeletal: Intact sensation, normal range of motionExtremities: Extremities normal, no cyanosis or edemaPsychiatric: Alert/oriented x 3, affect within normal limits, mood within normal limitsNeurological: Gait stable for age, no focal defects. motor exam grossly intactImaging Studies: [place] ABDOMEN PELVIS W WO IV CONTRAST on 03/25/2021 12:06 PM?INDICATION: History of HLRCC.?COMPARISON: Ultrasound 03/18/2020, [place] 03/06/2019?TECHNIQUE: [place] images of the abdomen and pelvis were obtained before and after the administration of 100 cc intravenous contrast (Omnipaque 350).?FINDINGS:Lung bases: Visualized lung bases are clear.?Hepatobiliary: Cholelithiasis.?Pancreas: Unremarkable.?Spleen: Unremarkable.?Adrenal glands: Unremarkable.?Kidneys: No renal mass.  ?Bowel: No bowel obstruction. Small hiatal hernia.?Abdominal and pelvic lymph nodes: No lymphadenopathy.?Peritoneum: No ascites.?Pelvis: No mass. Hysterectomy. Unchanged rounded structure in the right inguinal canal.?Musculoskeletal system and soft tissue: No aggressive osseous lesion.?IMPRESSION:No renal mass.?Reported And Signed By: Denton Ar, MD  [facility] Radiology and Biomedical ImagingLaboratory: N/APathology: N/AAssessment and Plan: The patient is a 67F  with a history of HLRCC undergoing ongoing kidney surveillance. We reviewed her diagnosis and management history to date including recent renal US from June 2023. . Will continue surveillance  consisting of kidney imaging. We have been alternating between renal US and [place] as she wishes to avoid MRI due to claustrophobia. Will order [place] in 1 year and follow up with APP in 1 year for further abdominal surveillance with plan for continued annual imaging.

## 2022-06-16 ENCOUNTER — Encounter: Admit: 2022-06-16 | Payer: PRIVATE HEALTH INSURANCE | Attending: Rheumatology

## 2022-06-16 DIAGNOSIS — M797 Fibromyalgia: Secondary | ICD-10-CM

## 2022-06-17 MED ORDER — GABAPENTIN 300 MG CAPSULE
300 mg | ORAL_CAPSULE | Freq: Every evening | ORAL | 3 refills | Status: AC
Start: 2022-06-17 — End: 2022-08-20

## 2022-06-17 NOTE — Telephone Encounter
LMTCB and confirm her dose.06-17-2022 pt returned call and confirmed she is taking three 300 mg capsules (900 mg) daily.Last visit 8-30-2023Next visit 09-29-2022, 7-29-2024Last labs:Lab Results Component Value Date  WBC 5.4 05/07/2022  HGB 13.6 05/07/2022  HCT 41.3 05/07/2022  MCV 94.1 05/07/2022  MCH 31.0 05/07/2022  MCHC 32.9 05/07/2022  PLT 289 05/07/2022  MPV 9.5 05/07/2022  NEUTROPHILS 55.5 05/07/2022  LYMPHOCYTES 33.3 05/07/2022  MONOCYTES 6.4 05/07/2022  EOSINOPHILS 4.1 05/07/2022  Lab Results Component Value Date  NA 140 05/07/2022  K 4.2 05/07/2022  CL 104 05/07/2022  CO2 29 05/07/2022  GLU 93 05/07/2022  BUN 10 05/07/2022  CREATININE 0.84 05/07/2022  EGFR >60 02/14/2013  EGFRAFRAMER 91 11/26/2020  CALCIUM 9.6 05/07/2022  ALBUMIN 4.5 05/07/2022  PROT 7.0 12/28/2018  BILITOT 0.5 05/07/2022  ALKPHOS 48 05/07/2022  ALT 19 05/07/2022  AST 16 05/07/2022  GLOB 2.7 05/07/2022  Lab Results Component Value Date  CRPQ 6.3 05/07/2022  SEDRATE 6 05/07/2022

## 2022-08-20 ENCOUNTER — Encounter: Admit: 2022-08-20 | Payer: PRIVATE HEALTH INSURANCE

## 2022-08-20 DIAGNOSIS — M797 Fibromyalgia: Secondary | ICD-10-CM

## 2022-08-20 MED ORDER — GABAPENTIN 300 MG CAPSULE
300 mg | ORAL_CAPSULE | Freq: Every evening | ORAL | 3 refills | Status: AC
Start: 2022-08-20 — End: ?

## 2022-08-20 NOTE — Telephone Encounter
Patient LM for refill of gabapentin 300 mg capsules at a pharmacy in [place].Per patient the last RX was sent to another pharmacy. Since this medication is considered a controlled substance they will not fill without a new RX.RN returned call to patient who states she is not out of capsules yet, but is due for a refill and leaving in a few days on vacation and wants to pick up beforehand.Last visit 8-30-2023Next visit 09-29-2022, 7-29-2024Last labs:Lab Results Component Value Date  WBC 5.4 05/07/2022  HGB 13.6 05/07/2022  HCT 41.3 05/07/2022  MCV 94.1 05/07/2022  MCH 31.0 05/07/2022  MCHC 32.9 05/07/2022  PLT 289 05/07/2022  MPV 9.5 05/07/2022  NEUTROPHILS 55.5 05/07/2022  LYMPHOCYTES 33.3 05/07/2022  MONOCYTES 6.4 05/07/2022  EOSINOPHILS 4.1 05/07/2022  Lab Results Component Value Date  NA 140 05/07/2022  K 4.2 05/07/2022  CL 104 05/07/2022  CO2 29 05/07/2022  GLU 93 05/07/2022  BUN 10 05/07/2022  CREATININE 0.84 05/07/2022  EGFR >60 02/14/2013  EGFRAFRAMER 91 11/26/2020  CALCIUM 9.6 05/07/2022  ALBUMIN 4.5 05/07/2022  PROT 7.0 12/28/2018  BILITOT 0.5 05/07/2022  ALKPHOS 48 05/07/2022  ALT 19 05/07/2022  AST 16 05/07/2022  GLOB 2.7 05/07/2022  Lab Results Component Value Date  CRPQ 6.3 05/07/2022  SEDRATE 6 05/07/2022

## 2022-08-26 ENCOUNTER — Telehealth: Admit: 2022-08-26 | Payer: PRIVATE HEALTH INSURANCE

## 2022-08-26 DIAGNOSIS — M2550 Pain in unspecified joint: Secondary | ICD-10-CM

## 2022-08-26 DIAGNOSIS — M797 Fibromyalgia: Secondary | ICD-10-CM

## 2022-08-26 DIAGNOSIS — E559 Vitamin D deficiency, unspecified: Secondary | ICD-10-CM

## 2022-08-26 DIAGNOSIS — E785 Hyperlipidemia, unspecified: Secondary | ICD-10-CM

## 2022-08-26 DIAGNOSIS — M8589 Other specified disorders of bone density and structure, multiple sites: Secondary | ICD-10-CM

## 2022-08-26 DIAGNOSIS — M47812 Spondylosis without myelopathy or radiculopathy, cervical region: Secondary | ICD-10-CM

## 2022-08-26 DIAGNOSIS — R7982 Elevated C-reactive protein (CRP): Secondary | ICD-10-CM

## 2022-08-26 DIAGNOSIS — Z1589 Genetic susceptibility to other disease: Secondary | ICD-10-CM

## 2022-08-26 DIAGNOSIS — M5416 Radiculopathy, lumbar region: Secondary | ICD-10-CM

## 2022-08-26 DIAGNOSIS — M25551 Pain in right hip: Secondary | ICD-10-CM

## 2022-08-26 NOTE — Telephone Encounter
Orders placed for basic 4 and lipid panel as per Dr. Jake Seats instructions. Also faxed to requested location.Advised patient we will order the lipid panel but going forward to contact her PCP as they would order the test and treat if needed.Patient has verbalized understanding and has no further questions at this time.

## 2022-08-26 NOTE — Telephone Encounter
Patient is calling to confirm her standing lab orders have been sent to [facility] is going tomorrow to [facility] in [place].The patient is asking if a cholesterol level could be ordered as well. Her last results were high and she is trying to make some changes and is wondering what her numbers are now.Please advise.

## 2022-08-29 LAB — LIPID PANEL
CHOLESTEROL, TOTAL: 191 mg/dL (ref <200)
CHOLESTEROL/HDL RATIO: 2.7 (calc) (ref <5.0)
HDL CHOLESTEROL: 70 mg/dL (ref >=50)
LDL CHOLESTEROL: 102 mg/dL — ABNORMAL HIGH
NON-HDL CHOLESTEROL: 121 mg/dL (ref <130)
TRIGLYCERIDES: 101 mg/dL (ref <150)

## 2022-08-29 LAB — SEDIMENTATION RATE (ESR): SED RATE BY MODIFIED WESTERGREN: 9 mm/h (ref <=30)

## 2022-08-29 LAB — C-REACTIVE PROTEIN     (CRP): C-REACTIVE PROTEIN: 8.3 mg/L — ABNORMAL HIGH (ref <8.0)

## 2022-09-29 ENCOUNTER — Encounter: Admit: 2022-09-29 | Payer: PRIVATE HEALTH INSURANCE | Attending: Rheumatology

## 2022-09-29 DIAGNOSIS — E559 Vitamin D deficiency, unspecified: Secondary | ICD-10-CM

## 2022-09-29 DIAGNOSIS — M797 Fibromyalgia: Secondary | ICD-10-CM

## 2022-09-29 DIAGNOSIS — R7982 Elevated C-reactive protein (CRP): Secondary | ICD-10-CM

## 2022-09-29 DIAGNOSIS — M5416 Radiculopathy, lumbar region: Secondary | ICD-10-CM

## 2022-09-29 DIAGNOSIS — M2550 Pain in unspecified joint: Secondary | ICD-10-CM

## 2022-09-29 DIAGNOSIS — M8589 Other specified disorders of bone density and structure, multiple sites: Secondary | ICD-10-CM

## 2022-09-29 DIAGNOSIS — Z1589 Genetic susceptibility to other disease: Secondary | ICD-10-CM

## 2022-09-29 DIAGNOSIS — M47812 Spondylosis without myelopathy or radiculopathy, cervical region: Secondary | ICD-10-CM

## 2022-09-29 NOTE — Progress Notes
[facility] [place], [place], [address]EXBMW: [phone]  Fax: [phone] Dyett Desir, MDSonia, Kallie Locks, MDKristin Vassie Loll PA-CFace to faceHistory of present Laurie White is a 62 y.o. female with Fibromyalgia , chronic joint pain in the setting of a normal ESR CRP and negative serologies, negative bone scan who presents for a follow up visit Last seen 8/30/2023History [name] presented  On 10/31/2012 as  a 62 year old with a strong family history of Inflammatory arthritis in her mother and sister Laurie White) who was well until her early 46s when she developed initial symptoms of numbness in her hands and pain in her fingers. She presented for a Rheumatology consultation for joint pain. 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 She notes she saw a specialist several years ago who she thinks was a neurologist. She had nerve conduction studies which apparently showed carpal tunnel syndrome of unknown severity. She was told to wear wrist splints.  She  had stiffness in her fingers and her hands and knuckles were sore. She also noted pain in her DIP joints.She saw a Rheumatologist Preston Fleeting MD, who gave her Mobic  and  followed the patient her for 3 years. She notes Stevphen Meuse MD for the last 2 years.  She notes she will not return to that office.She has had stiffness in neck , and pain in her ankles , feet,   fingers , wrists, and hips.  She has the worst stiffness is for 10 minutes in the mornings, but she has stiffness all day.  She notes she was seen last by Stevphen Meuse MD on 09/13/2012 and was given Prednisone 10 mg po QAM.She notes she has gained 20 lbs  Over a year.On  Prednisone. She noted marked improvement. She has less joint pain. She had concerns because she can hear  cracking sound in her neck. She notes persistent stiffness in her ankleships are still stiff and  sore.  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 COVID-19 Vaccine - PFIZER 12/26/2019, 01/16/2020 Interim history8/30/2023 She had an annual wellness visit with Rexene Agent MD10/10/2021: She had a telemedicine visit with Elie Goody, MD, urology, for a follow-up. She continues surveillance consisting of kidney imaging. She was advised to repeat [place] after 1 year. She is scheduled for a Nicholson abdomen 06/09/2023 She will see Sanda Linger PA 10/9/2024Social history Married to [name] who is retired.They are living in [place]. She has been there sicne  April 2022Daughter Laurie White  25 (2/9) works in [employer] system as an MSW . 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  He is get  married in [place] 10/31/2022. She will drive to [place] from [place]. Her son and future daughter in law may move to the [place]. Visited her  sister Laurie White and will see her again today.Travel historyShe is in [place] every 4-6 weeks to [place] to see her sister.Leaves for [place] tomorrow 1/24/2024[place] 05/16/2022 Medication historyShe has failed a number of medications including  Savella, Ultram, Neurontin, Cymbalta, etodolac,   Lodine, Mobic and  could not afford Celebrex.Healthcare team PCP Thomasenia Bottoms MD/ Rexene Agent MDUrology Elie Goody MDOrtho [place] -Billey Gosling MD.Podiatry Barbie Haggis II DPMFamily history She has a strong family history of Inflammatory arthritis in her mother and sister Laurie White)  She has a strong family history of Inflammatory arthritis in her mother and sister Laurie White)  She lost her mother in June  2016  from Renal Cancer. Her children are negative for the GENE.Current Outpatient Medications Medication Sig ? calcium carbonate/vitamin D3 (VITAMIN D-3 ORAL) Take by mouth. Take 2000 mg iu ? ferrous sulfate (IRON ORAL) Take by mouth. ? gabapentin Take 3 capsules (900 mg total) by mouth nightly. Take 3 hours before bed. ? levothyroxine 1 tablet (50 mcg total) daily. ? tiZANidine TAKE 2 TABLETS (4 MG TOTAL) BY MOUTH NIGHTLY AS NEEDED. FOR BACK PAIN ? venlafaxine Take 2 tablets (150 mg total) by mouth daily. No current facility-administered medications for this visit. No Known AllergiesReview of Systems (negative except as noted; positive findings bold)Constitutional: fatigue, malaise, fever, chills, sweats, weakness, feeling sickly, swollen glands, weight loss, weight gain, weight changes. Current Weight 205 down from 206 llbsPeak 220 lbs Eye : Eye pain, eye redness, dry eye, loss of vision, double vision, blurry vision ENT : Ringing in the ears, hearing loss, nose bleeds, dry nose, dry mouth, mouth sores, loss of taste, hoarseness, problems with smell, lump in throat, stuffy nose, post-nasal drip Cardiac : Chest pain, irregular heart beat, palpitations, murmur, fainting spells (syncope), swollen legs or feetResp: Shortness of breath, difficulty breathing at night, cough, wheezingGI : Nausea, vomiting, vomiting of blood or coffee grounds, abdominal pain, constipation, diarrhea, blood in stools, black stools, heart burn, loss of appetite, trouble swallowingGenitourinary: Difficult urination, pain or burning on urination, blood in urine, cloudy/smoky urine, and vaginal discharge, miscarriagesMusculoskeletal: Morning stiffnessJoint pain, Joint swelling, muscle weakness, muscle tenderness, muscle spasm, neck pain, lower back painSkin: Easy bruising, rash, psoriasis, hives, skin tightness, hair lossRaynaud's: Color changes of hands and feet in the coldNeurology: Headaches, dizziness, problems with memory, loss of balance, numbness, tingling, problems with thinkingPsychiatric: anxiety, depression, problems with social activitiesSleep: Difficulty falling asleep, difficulty staying asleepEndocrine: Excessive thirst BP 123/86  - Pulse 67  - Wt 93 kg  - SpO2 100%  - BMI 31.17 kg/m?  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 No clubbing or edema is present.  Large varicosities ( head and neck)    NEUROLOGIC: normoactive symmetric reflexes, no sensory deficits, Positive L>>>R Tinel's test. MUSCULOSKELETAL:Muscle tone and strength are normal.   No kyphosis is noted.   She had mild decrease in ROM of her  neck &  No trap spasm +decrease in ROM  of  lower back.     Para lumbar tenderness  On examination of her joints she had OA changes of her  DIPs, Resolved trigger finger 3rd finger left hand Normal PIPs & Normal MCPs   She had full ROM of her wrists, elbows & shoulders.   + Ulnar tinel'sShe had a mild decrease in  ROM of her hips. Non tender  right trochanteric bursa   Knees had FROM and there was no evidence of an effusion; she had no instability; no  varus or valgus deformities.       he had no acute synovitis of any joints.       She had paired positive paired tender points .Labs reviewed with patient Lab Results Component Value Date  WBC 4.7 08/27/2022  HGB 14.0 08/27/2022  HCT 41.3 08/27/2022  MCV 95.2 08/27/2022  MCH 32.3 08/27/2022  MCHC 33.9 08/27/2022  PLT 348 08/27/2022  MPV 9.5 08/27/2022  NEUTROPHILS 56.5 08/27/2022  LYMPHOCYTES 33.5 08/27/2022  MONOCYTES 7.7 08/27/2022  EOSINOPHILS 1.7 08/27/2022  Lab Results Component Value Date  NA 140 08/27/2022  K 4.0 08/27/2022  CL 105 08/27/2022  CO2 30 08/27/2022  GLU 98 08/27/2022  BUN 10 08/27/2022  CREATININE 0.76 08/27/2022  EGFR >60 02/14/2013  EGFRAFRAMER 91 11/26/2020  CALCIUM 8.8 08/27/2022  ALBUMIN 4.3 08/27/2022  PROT 7.0 12/28/2018  BILITOT 0.8 08/27/2022  ALKPHOS 49 08/27/2022  ALT 15 08/27/2022  AST 15 08/27/2022  GLOB 2.6 08/27/2022  Lab Results Component Value Date  SEDRATE 9 08/27/2022  SEDRATE 6 05/07/2022  SEDRATE 2 03/06/2022  Lab Results Component Value Date  CRPQ 8.3 (H) 08/27/2022   Latest Reference Range & Units 09/05/21 11:50 12/10/21 10:34 03/06/22 11:45 05/07/22 11:28 08/27/22 10:02 CRP <8.0 mg/L 7.1 7.7 6.6 6.3 8.3 (H)  Latest Reference Range & Units 12/10/21 10:34 03/06/22 11:45 05/07/22 11:28 08/27/22 10:02 Sed Rate By Modified Westergren < OR = 30 mm/h 2 2 6 9   Latest Reference Range & Units 09/26/20 10:59 09/05/21 11:50 12/10/21 10:34 05/07/22 11:28 Cholesterol, Total <200 mg/dL    811 (H) HDL Cholesterol > OR = 50 mg/dL    77 LDL Cholesterol mg/dL (calc) 914 (H) Non-HDL Cholesterol <130 mg/dL (calc)    782 (H) Chol/HDL Ratio <5.0 (calc)    2.9 Iron, Total 45 - 160 mcg/dL 61   78 Iron Binding Capacity 250 - 450 mcg/dL (calc) 956   213 Ferritin 16 - 232 ng/mL 14 (L)   23 Creatine Kinase, Total 29 - 143 U/L    71 Vitamin D, 25 OH, Total 30 - 100 ng/mL  39 32 45 XR LUMBAR SPINE AP AND LATERAL 10/4/22Impression: Multilevel degenerative changes of the lower lumbar spine.XR HIPS BILATERAL AP LATERAL W AP PELVIS 10/4/22Impression: Mild degenerative changes involving bilateral hip joints. Barton ABDOMEN PELVIS W WO IV CONTRAST on 03/25/2021 12:06 PMImpression: No renal mass.03/06/19 XR Hips b/l AP lateral Normal Hips 03/06/2019   XR Lumbar Spine AP/ Lateral DDDThere is mild L3-4, moderate L4-5, and moderately severe L5-S1 degenerative disc disease with facet arthropathy in the lower lumbar spine.03/06/2019  ABD/Pelvis W/WO IV ContrastClinical indications: History of hereditary leiomyomatosis and renal cell cancer syndromeImpresson: No evidence of renal mass, small left inguinal hernia Bone Density: Exam date 02/11/2020Region BMD(g/cm2) T-score Z-score Classification AP Spine (L1-L4) 1.233  1.7  2.8 Normal Femoral Neck (Left) 0.943  0.8  1.9 Normal Total Hip (Left) 1.109  1.4  2.1 Normal  10-year Fracture Risk?: Major Osteoporotic Fracture 4.6% Hip Fracture < 0.1%  07/15/2016   BMD Right femur t score -0.7 L1-4  t score 1.7Left femur  -0.1Old data 06/29/2016 Lyme IgG WB  positive  11/26/2015  Urine Cx Calcium Oxalate BMD was normal in 04/13/2013 12/16/2012  Bone scan Mild uptake Right AC joint2/25/2014  ANA negRF neg at 6.20, CCP neg SPEP negParvovirus IgM , IgG negHep screen negACE 44 ( normal) HLA  B 27 +  09/26/2022  10:26 AM 05/03/2022   9:27 AM 09/22/2021  11:24 PM 06/10/2021  11:34 AM 09/10/2020   3:07 PM 03/26/2020   7:55 AM 09/12/2019  11:56 AM Rapid 3 Assessment Function 1 2 4 4 1 1 1  Pain 6 4 5  4.5 3.5 3 6  Patient Global 4 3 4.5 5 5 7 3  Rapid3 (of 30) 10.3 7.7 10.8 10.8 8.8 10.3 9.3   09/26/2022  10:28 AM 05/03/2022   9:29 AM 09/22/2021  11:26 PM 06/10/2021  11:36 AM 09/10/2020   3:05 PM 03/26/2020   7:57 AM 09/12/2019  11:54 AM Global Health Scale Score Global Physical Health Raw Score: 14 13 13 13 16 15 14  Global Physical Health T-Score: 37.4 34.9 34.9 34.9 39.8 37.4 37.4 Global Mental Health Raw Score:  17 16 15 16 19 17 17  Global Mental Health T-Score: 56 53.3 50.8 53.3 62.5 56 56 Calculated EQ-5D: 0.77 0.69 0.67 0.69 0.81 0.79 0.76 Assessment/PlanCOVID STATUS She declines the booster or bivalent vaccines.She declines further discussion. FIBROMYALGIA She is currently on Effexor 150 mg  2 po QD &  Neurontin 900mg  3 hours before bed, per this office Failed Cymbalta, Ultram as above.   She has had less symptoms with hyperesthesia and  restless legs  I don't notice it as much She is improved on the Effexor 300  mg QD from this office.Her neurontin was refilled 07/13/2022 for 90 days. LOWER BACK PAIN  She plans to see Dr Jackquline Bosch on 3/8/2023Previous XRs showed moderately severe DDD at L2-S2, with severe changes at L5-S1.  She has had some lower back painShe did not like the effects of the tizanidine 2-4 mg at nightShe is not taking it due to feeling oversedated.She stopped the tizanidine and prefers to continue the Neurontin 900  Mg 3 hours before bed from this office. [facility]. Weight loss was advised.RIGHT HIP PAINShe has DJD of her hips and trochanteric bursitis She notes it is about the sameIt flares up after sitting for some time.She has pain in her anterior thigh.She had no response to Aspercreme and Aspercreme with lidocaine. She had mild DJD on her hips.POSSIBLE MERALGIA PARESTHETICAShe has noted an intermittent strange sensation in her right thigh.She may need a NCS if it recurs and stays.POSSIBLE RLS She likely has symptoms of restless legsShe has not discussed this with her PCP.She  continues gabapentin QPM HLA B 27 + Nothing to suggest ankylosing spondylitis She has no synovtis.A bone scan was negative in the past.KNEE PAINShe has intermittent soreness with stairs.She was advised to focus on weight lossShe lives in a single floor dwelling.  HEALTH MAINTENANCE Colonoscopy May 24 2017Had a CPE on 05/06/2022.WEIGHT She has not lost weight since her last visit She plans to continue to lower her  caloric in-take and increase her activity level.BONE PAIN BMD was normal 10/2018.Her vitamin D was 45 on 05/07/2022.Follow up Gordon-Dole MDScribed for Elenor Quinones, MD by Doristine Mango, medical scribe September 29, 2022 The documentation recorded by the scribe accurately reflects the services I personally performed and the decisions made by  me. I reviewed and confirmed all material entered and/or pre-charted by the scribe. Elenor Quinones, MDElectronically Signed by Elenor Quinones, MD, September 29, 2022

## 2022-11-13 ENCOUNTER — Encounter: Admit: 2022-11-13 | Payer: PRIVATE HEALTH INSURANCE | Attending: Rheumatology

## 2022-11-13 DIAGNOSIS — M797 Fibromyalgia: Secondary | ICD-10-CM

## 2022-11-13 NOTE — Telephone Encounter
Last visit: 1/23/24Next visit: 7/29/24Last labs:Lab Results Component Value Date  WBC 4.7 08/27/2022  HGB 14.0 08/27/2022  HCT 41.3 08/27/2022  MCV 95.2 08/27/2022  MCH 32.3 08/27/2022  MCHC 33.9 08/27/2022  PLT 348 08/27/2022  MPV 9.5 08/27/2022  NEUTROPHILS 56.5 08/27/2022  LYMPHOCYTES 33.5 08/27/2022  MONOCYTES 7.7 08/27/2022  EOSINOPHILS 1.7 08/27/2022  Lab Results Component Value Date  NA 140 08/27/2022  K 4.0 08/27/2022  CL 105 08/27/2022  CO2 30 08/27/2022  GLU 98 08/27/2022  BUN 10 08/27/2022  CREATININE 0.76 08/27/2022  EGFR >60 02/14/2013  EGFRAFRAMER 91 11/26/2020  CALCIUM 8.8 08/27/2022  ALBUMIN 4.3 08/27/2022  PROT 7.0 12/28/2018  BILITOT 0.8 08/27/2022  ALKPHOS 49 08/27/2022  ALT 15 08/27/2022  AST 15 08/27/2022  GLOB 2.6 08/27/2022  Lab Results Component Value Date  SEDRATE 9 08/27/2022

## 2022-11-16 ENCOUNTER — Encounter: Admit: 2022-11-16 | Payer: PRIVATE HEALTH INSURANCE | Attending: Rheumatology

## 2022-11-16 DIAGNOSIS — M797 Fibromyalgia: Secondary | ICD-10-CM

## 2022-11-17 MED ORDER — GABAPENTIN 300 MG CAPSULE
300 mg | ORAL_CAPSULE | Freq: Every evening | ORAL | 3 refills | Status: AC
Start: 2022-11-17 — End: ?

## 2022-11-17 NOTE — Telephone Encounter
Last visit: 1/23/24Next visit: 7/29/24Last labs:Lab Results Component Value Date  WBC 4.7 08/27/2022  HGB 14.0 08/27/2022  HCT 41.3 08/27/2022  MCV 95.2 08/27/2022  MCH 32.3 08/27/2022  MCHC 33.9 08/27/2022  PLT 348 08/27/2022  MPV 9.5 08/27/2022  NEUTROPHILS 56.5 08/27/2022  LYMPHOCYTES 33.5 08/27/2022  MONOCYTES 7.7 08/27/2022  EOSINOPHILS 1.7 08/27/2022  Lab Results Component Value Date  NA 140 08/27/2022  K 4.0 08/27/2022  CL 105 08/27/2022  CO2 30 08/27/2022  GLU 98 08/27/2022  BUN 10 08/27/2022  CREATININE 0.76 08/27/2022  EGFR >60 02/14/2013  EGFRAFRAMER 91 11/26/2020  CALCIUM 8.8 08/27/2022  ALBUMIN 4.3 08/27/2022  PROT 7.0 12/28/2018  BILITOT 0.8 08/27/2022  ALKPHOS 49 08/27/2022  ALT 15 08/27/2022  AST 15 08/27/2022  GLOB 2.6 08/27/2022  Lab Results Component Value Date  SEDRATE 9 08/27/2022

## 2022-11-26 ENCOUNTER — Encounter: Admit: 2022-11-26 | Payer: PRIVATE HEALTH INSURANCE | Attending: Rheumatology

## 2022-11-26 DIAGNOSIS — M797 Fibromyalgia: Secondary | ICD-10-CM

## 2022-11-26 MED ORDER — VENLAFAXINE IMMEDIATE RELEASE 75 MG TABLET
75 mg | ORAL_TABLET | Freq: Every day | ORAL | 1 refills | Status: AC
Start: 2022-11-26 — End: ?

## 2022-11-26 MED ORDER — GABAPENTIN 300 MG CAPSULE
300 mg | ORAL_CAPSULE | Freq: Every evening | ORAL | 1 refills | Status: AC
Start: 2022-11-26 — End: ?

## 2022-11-26 NOTE — Telephone Encounter
Pt was told by [facility] that they can do a 90 day supply of both of these medications but they are asking for new prescriptions for Effexor and Gabapentin.Last visit: 1/23/2024Next visit: 04/05/2023 & 1/23/2025Last labs:Lab Results Component Value Date  WBC 4.7 08/27/2022  HGB 14.0 08/27/2022  HCT 41.3 08/27/2022  MCV 95.2 08/27/2022  MCH 32.3 08/27/2022  MCHC 33.9 08/27/2022  PLT 348 08/27/2022  MPV 9.5 08/27/2022  NEUTROPHILS 56.5 08/27/2022  LYMPHOCYTES 33.5 08/27/2022  MONOCYTES 7.7 08/27/2022  EOSINOPHILS 1.7 08/27/2022  Lab Results Component Value Date  NA 140 08/27/2022  K 4.0 08/27/2022  CL 105 08/27/2022  CO2 30 08/27/2022  GLU 98 08/27/2022  BUN 10 08/27/2022  CREATININE 0.76 08/27/2022  EGFR >60 02/14/2013  EGFRAFRAMER 91 11/26/2020  CALCIUM 8.8 08/27/2022  ALBUMIN 4.3 08/27/2022  PROT 7.0 12/28/2018  BILITOT 0.8 08/27/2022  ALKPHOS 49 08/27/2022  ALT 15 08/27/2022  AST 15 08/27/2022  GLOB 2.6 08/27/2022  Lab Results Component Value Date  CRPQ 8.3 (H) 08/27/2022  SEDRATE 9 08/27/2022

## 2023-03-14 ENCOUNTER — Encounter: Admit: 2023-03-14 | Payer: PRIVATE HEALTH INSURANCE | Attending: Rheumatology

## 2023-03-15 ENCOUNTER — Encounter: Admit: 2023-03-15 | Payer: PRIVATE HEALTH INSURANCE | Attending: Rheumatology

## 2023-03-15 DIAGNOSIS — M797 Fibromyalgia: Secondary | ICD-10-CM

## 2023-03-15 MED ORDER — GABAPENTIN 300 MG CAPSULE
300 | ORAL_CAPSULE | Freq: Every evening | ORAL | 1 refills | Status: AC
Start: 2023-03-15 — End: ?

## 2023-03-15 MED ORDER — VENLAFAXINE IMMEDIATE RELEASE 75 MG TABLET
75 | ORAL_TABLET | Freq: Every day | ORAL | 1 refills | Status: AC
Start: 2023-03-15 — End: ?

## 2023-03-15 NOTE — Telephone Encounter
Last visit: 1-23-2024Next visit: 04-05-2023, 1-23-2025Last labs:Lab Results Component Value Date  WBC 4.7 08/27/2022  HGB 14.0 08/27/2022  HCT 41.3 08/27/2022  MCV 95.2 08/27/2022  MCH 32.3 08/27/2022  MCHC 33.9 08/27/2022  PLT 348 08/27/2022  MPV 9.5 08/27/2022  NEUTROPHILS 56.5 08/27/2022  LYMPHOCYTES 33.5 08/27/2022  MONOCYTES 7.7 08/27/2022  EOSINOPHILS 1.7 08/27/2022  Lab Results Component Value Date  NA 140 08/27/2022  K 4.0 08/27/2022  CL 105 08/27/2022  CO2 30 08/27/2022  GLU 98 08/27/2022  BUN 10 08/27/2022  CREATININE 0.76 08/27/2022  EGFR >60 02/14/2013  EGFRAFRAMER 91 11/26/2020  CALCIUM 8.8 08/27/2022  ALBUMIN 4.3 08/27/2022  PROT 7.0 12/28/2018  BILITOT 0.8 08/27/2022  ALKPHOS 49 08/27/2022  ALT 15 08/27/2022  AST 15 08/27/2022  GLOB 2.6 08/27/2022  Lab Results Component Value Date  CRPQ 8.3 (H) 08/27/2022  SEDRATE 9 08/27/2022

## 2023-03-15 NOTE — Telephone Encounter
RN has informed patient to update her blood work for Dr. Roger Shelter,.Per patient she is scheduled for her blood draw at [facility] on 03-31-2023.The patient also will call to have [place] fax the orders as well so they do not turn her away for no order in the system.

## 2023-04-01 LAB — C-REACTIVE PROTEIN     (CRP): C-REACTIVE PROTEIN: 5.3 mg/L (ref <8.0)

## 2023-04-01 LAB — SEDIMENTATION RATE (ESR): SED RATE BY MODIFIED WESTERGREN: 9 mm/h (ref <=30)

## 2023-04-01 LAB — VITAMIN D, 25-HYDROXY: VITAMIN D, 25 OH, TOTAL: 47 ng/mL (ref 30–100)

## 2023-04-05 ENCOUNTER — Encounter: Admit: 2023-04-05 | Payer: PRIVATE HEALTH INSURANCE | Attending: Rheumatology

## 2023-04-05 DIAGNOSIS — M2550 Pain in unspecified joint: Secondary | ICD-10-CM

## 2023-04-05 DIAGNOSIS — D3610 Benign neoplasm of peripheral nerves and autonomic nervous system, unspecified: Secondary | ICD-10-CM

## 2023-04-05 DIAGNOSIS — A6920 Lyme disease, unspecified: Secondary | ICD-10-CM

## 2023-04-05 DIAGNOSIS — M47812 Spondylosis without myelopathy or radiculopathy, cervical region: Secondary | ICD-10-CM

## 2023-04-05 DIAGNOSIS — M5416 Radiculopathy, lumbar region: Secondary | ICD-10-CM

## 2023-04-05 DIAGNOSIS — M6530 Trigger finger, unspecified finger: Secondary | ICD-10-CM

## 2023-04-05 DIAGNOSIS — M797 Fibromyalgia: Secondary | ICD-10-CM

## 2023-04-05 DIAGNOSIS — E559 Vitamin D deficiency, unspecified: Secondary | ICD-10-CM

## 2023-04-05 DIAGNOSIS — M8589 Other specified disorders of bone density and structure, multiple sites: Secondary | ICD-10-CM

## 2023-04-05 DIAGNOSIS — M25551 Pain in right hip: Secondary | ICD-10-CM

## 2023-04-05 DIAGNOSIS — M722 Plantar fascial fibromatosis: Secondary | ICD-10-CM

## 2023-04-05 DIAGNOSIS — Q825 Congenital non-neoplastic nevus: Secondary | ICD-10-CM

## 2023-04-05 DIAGNOSIS — M1990 Unspecified osteoarthritis, unspecified site: Secondary | ICD-10-CM

## 2023-04-05 DIAGNOSIS — L821 Other seborrheic keratosis: Secondary | ICD-10-CM

## 2023-04-05 DIAGNOSIS — Z1589 Genetic susceptibility to other disease: Secondary | ICD-10-CM

## 2023-04-05 DIAGNOSIS — I889 Nonspecific lymphadenitis, unspecified: Secondary | ICD-10-CM

## 2023-04-05 DIAGNOSIS — R7982 Elevated C-reactive protein (CRP): Secondary | ICD-10-CM

## 2023-04-05 NOTE — Progress Notes
[facility] [place], [place], [address]HCWCB: [phone]  Fax: [phone] Dyett Desir, MDSonia, Kallie Locks, MDKristin Vassie Loll PA-CFace to faceHistory of present Laurie White is a 62 y.o. female with Fibromyalgia , chronic joint pain in the setting of a normal ESR CRP and negative serologies, negative bone scan who presents for a follow up visit Last seen 01/23/2024History [name] presented  On 10/31/2012 as  a 62 year old with a strong family history of Inflammatory arthritis in her mother and sister Laurie White) who was well until her early 94's when she developed initial symptoms of numbness in her hands and pain in her fingers. She presented for a Rheumatology consultation for joint pain. 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 She notes she saw a specialist several years ago who she thinks was a neurologist. She had nerve conduction studies which apparently showed carpal tunnel syndrome of unknown severity. She was told to wear wrist splints.  She  had stiffness in her fingers and her hands and knuckles were sore. She also noted pain in her DIP joints.She saw a Rheumatologist Preston Fleeting MD, who gave her Mobic  and  followed the patient her for 3 years. She notes Stevphen Meuse MD for the last 2 years.  She notes she will not return to that office.She has had stiffness in neck , and pain in her ankles , feet,   fingers , wrists, and hips.  She has the worst stiffness is for 10 minutes in the mornings, but she has stiffness all day.  She notes she was seen last by Stevphen Meuse MD on 09/13/2012 and was given Prednisone 10 mg po QAM.She notes she has gained 20 lbs  Over a year.On  Prednisone. She noted marked improvement. She has less joint pain. She had concerns because she can hear  cracking sound in her neck. She notes persistent stiffness in her ankleships are still stiff and  sore.  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 She  had negative serologies including an RF and CCP.She was also diagnosed with Plantar fasciitis and was advised that she still may develop a  spondyloarthropathy. She is HLA B 27 +  A bone scan  was neg for inflam arthritis.She was diagnosed with FibromyalgiaShe failed Cymbalta and Neurontin and was transitioned to Effexor with good results.Immunization History Administered Date(s) Administered  COVID-19 Vaccine - PFIZER 12/26/2019, 01/16/2020 Interim historyShe does not yet have a PCP in [place]. She had an eye exam with Dr Leavy Cella in [place].  04/28/2022 [facility] [place] [phone]. She had normal pressures. 11/26/2022: She messaged me via [employer], requesting for a 90 days supply for gabapentin 300 mg and venlafaxine 75 mg at [facility], [place]. A prescription was sent for 90 days supply.  Social history Married to [place] who is retired.They are living in [place]. She has been there since April 2022Daughter [name]  26 (2/9) works in [employer]  system as a Pediatric MSW started in December 2022Son  [name], 28 (7/23)  is in Counselling psychologist at [employer] in [place].  He got married in [place] 10/31/2022. She drove to [place] from [place]. Her son and future daughter in law [name] [name] has her masters in Art History and works for [employer] ) She notes her son and daughter in law plan to relocate to the  [place]. Visited her sister Laurie White yesterday.Travel historyShe is in [place] every 4-6 weeks to [place] to see her sister.Floreine lives in [place] Medication historyShe has failed a number of medications including  Savella, Ultram, Neurontin, Cymbalta, etodolac,   Lodine, Mobic and  could not afford Celebrex.Healthcare team PCP Laurie Bottoms MD/ Laurie Agent APRN  [place], CTUrology Laurie White MDOrtho [place] -Laurie Gosling MD.Podiatry Laurie White DPMFamily history She has a strong family history of Inflammatory arthritis in her mother and sister Laurie White)  She has a strong family history of Inflammatory arthritis in her mother and sister Laurie White)  She lost her mother in June  2016  from Renal Cancer. Her children are negative for the GENE.Current Outpatient Medications Medication Sig  calcium carbonate/vitamin D3 (VITAMIN D-3 ORAL) Take by mouth. Take 2000 mg iu  ferrous sulfate (IRON ORAL) Take by mouth.  gabapentin Take 3 capsules (900 mg total) by mouth nightly. Take 3 hours before bed.  gabapentin Take 3 capsules (900 mg total) by mouth nightly. Take 3 hours before bed.  levothyroxine 1 tablet (50 mcg total) daily.  tiZANidine TAKE 2 TABLETS (4 MG TOTAL) BY MOUTH NIGHTLY AS NEEDED. FOR BACK PAIN  venlafaxine Take 2 tablets (150 mg total) by mouth daily. No current facility-administered medications for this visit. No Known AllergiesReview of Systems (negative except as noted; positive findings bold)Constitutional: fatigue, malaise, fever, chills, sweats, weakness, feeling sickly, swollen glands, weight loss, weight gain, weight changes. Current Weight 201 down from 205  lbsPeak 220 lbs Eye : Eye pain, eye redness, dry eye, loss of vision, double vision, blurry vision ENT : Ringing in the ears, hearing loss, nose bleeds, dry nose, dry mouth, mouth sores, loss of taste, hoarseness, problems with smell, lump in throat, stuffy nose, post-nasal drip Cardiac : Chest pain, irregular heart beat, palpitations, murmur, fainting spells (syncope), swollen legs or feetResp: Shortness of breath, difficulty breathing at night, cough, wheezingGI : Nausea, vomiting, vomiting of blood or coffee grounds, abdominal pain, constipation, diarrhea, blood in stools, black stools, heart burn, loss of appetite, trouble swallowingGenitourinary: Difficult urination, pain or burning on urination, blood in urine, cloudy/smoky urine,  Musculoskeletal: Morning stiffnessJoint pain, Joint swelling, muscle weakness, muscle tenderness, muscle spasm, neck pain, lower back painSkin: Easy bruising, rash, psoriasis, hives, skin tightness, hair lossRaynaud's: Color changes of hands and feet in the coldNeurology: Headaches, dizziness, problems with memory, loss of balance, numbness, tingling, problems with thinkingPsychiatric: anxiety, depression, problems with social activitiesSleep: Difficulty falling asleep, difficulty staying asleepEndocrine: Excessive thirst BP 115/77 (Site: l a, Position: Sitting)  - Pulse 71  - Temp 97.9 ?F (36.6 ?C)  - Wt 91.2 kg  - SpO2 97%  - BMI 30.56 kg/m?  Physical ExamGENERAL: alert and oriented X 3, HEENT:   no alopecia,  conjunctiva moist, Large varicosities in her tongue, and on her lips ( Purple discoloration-congenital) NECK: Hemangioma , right side of neck , No  axillary adenopathy  LUNGS: clear  COR: regular rate and rhythm, SKIN:  as aboveVASCULAR: temporal, radial, dp and posterior tibial arteries pulsatile. No clubbing or edema is present.  Large varicosities ( head and neck)    NEUROLOGIC: normoactive symmetric reflexes, no sensory deficits, Positive L>>>R Tinel's test. MUSCULOSKELETAL:Muscle tone and strength are normal.   No kyphosis is noted.   She had mild decrease in ROM of her  neck &  No trap spasm +decrease in ROM of  lower back.     Para lumbar tenderness  On examination of her joints she had OA changes of her  DIPs, Resolved trigger finger 3rd finger left hand Normal PIPs & Normal MCPs   She had full ROM of her wrists, elbows & shoulders.   + Ulnar tinel'sShe had a mild decrease in  ROM of her hips. Non tender  right trochanteric bursa   Knees had FROM and there was no evidence of an effusion; she had no instability; no  varus or valgus deformities.       he had no acute synovitis of any joints.       She had paired positive paired tender points. Labs reviewed with patient Lab Results Component Value Date  WBC 5.5 03/31/2023  HGB 14.1 03/31/2023  HCT 42.3 03/31/2023  MCV 97.0 03/31/2023  MCH 32.3 03/31/2023  MCHC 33.3 03/31/2023  PLT 318 03/31/2023  MPV 9.5 03/31/2023  NEUTROPHILS 54 03/31/2023  LYMPHOCYTES 34.0 03/31/2023  MONOCYTES 8.2 03/31/2023  EOSINOPHILS 3.1 03/31/2023  Lab Results Component Value Date  NA 139 03/31/2023  K 4.5 03/31/2023  CL 105 03/31/2023  CO2 25 03/31/2023  GLU 99 03/31/2023  BUN 12 03/31/2023  CREATININE 0.88 03/31/2023  EGFR >60 02/14/2013  EGFRAFRAMER 91 11/26/2020  CALCIUM 9.4 03/31/2023  ALBUMIN 4.3 03/31/2023  PROT 7.0 12/28/2018  BILITOT 0.6 03/31/2023  ALKPHOS 48 03/31/2023  ALT 17 03/31/2023  AST 16 03/31/2023  GLOB 2.8 03/31/2023  Lab Results Component Value Date  SEDRATE 9 03/31/2023  SEDRATE 9 08/27/2022  SEDRATE 6 05/07/2022  Latest Reference Range & Units 09/05/21 11:50 12/10/21 10:34 03/06/22 11:45 05/07/22 11:28 08/27/22 10:02 CRP <8.0 mg/L 7.1 7.7 6.6 6.3 8.3 (H)  Latest Reference Range & Units 12/10/21 10:34 03/06/22 11:45 05/07/22 11:28 08/27/22 10:02 Sed Rate By Modified Westergren < OR = 30 mm/h 2 2 6 9   Latest Reference Range & Units 09/26/20 10:59 09/05/21 11:50 12/10/21 10:34 05/07/22 11:28 Cholesterol, Total <200 mg/dL    096 (H) HDL Cholesterol > OR = 50 mg/dL    77 LDL Cholesterol mg/dL (calc)    045 (H) Non-HDL Cholesterol <130 mg/dL (calc)    409 (H) Chol/HDL Ratio <5.0 (calc)    2.9 Iron, Total 45 - 160 mcg/dL 61   78 Iron Binding Capacity 250 - 450 mcg/dL (calc) 811   914 Ferritin 16 - 232 ng/mL 14 (L)   23 Creatine Kinase, Total 29 - 143 U/L    71 Vitamin D, 25 OH, Total 30 - 100 ng/mL  39 32 45 XR LUMBAR SPINE AP AND LATERAL 10/4/22Impression: Multilevel degenerative changes of the lower lumbar spine.XR HIPS BILATERAL AP LATERAL W AP PELVIS 10/4/22Impression: Mild degenerative changes involving bilateral hip joints.[place] ABDOMEN PELVIS W WO IV CONTRAST on 03/25/2021 12:06 PMImpression: No renal mass.03/06/19 XR Hips b/l AP lateral Normal Hips 03/06/2019   XR Lumbar Spine AP/ Lateral DDDThere is mild L3-4, moderate L4-5, and moderately severe L5-S1 degenerative disc disease with facet arthropathy in the lower lumbar spine.03/06/2019 [place] ABD/Pelvis W/WO IV ContrastClinical indications: History of hereditary leiomyomatosis and renal cell cancer syndromeImpresson: No evidence of renal mass, small left inguinal hernia Bone Density: Exam date 02/11/2020Region BMD(g/cm2)  T-score Z-score Classification AP Spine (L1-L4) 1.233  1.7  2.8 Normal Femoral Neck (Left) 0.943  0.8  1.9 Normal Total Hip (Left) 1.109  1.4  2.1 Normal  10-year Fracture Risk?: Major Osteoporotic Fracture 4.6% Hip Fracture < 0.1% 07/15/2016   BMD Right femur t score -0.7 L1-4  t score 1.7Left femur  -0.1Old data 06/29/2016 Lyme IgG WB  positive  11/26/2015  Urine Cx Calcium Oxalate BMD was normal in 04/13/2013 12/16/2012  Bone scan Mild uptake Right AC joint2/25/2014  ANA negRF neg at 6.20, CCP neg SPEP negParvovirus IgM , IgG negHep screen negACE 44 ( normal) HLA  B 27 +  04/02/2023  10:08 AM 09/26/2022 10:26 AM 05/03/2022   9:27 AM 09/22/2021  11:24 PM 06/10/2021  11:34 AM 09/10/2020   3:07 PM 03/26/2020   7:55 AM Rapid 3 Assessment Function 1 1 2 4 4 1 1  Pain 4.5 6 4 5  4.5 3.5 3 Patient Global 4 4 3  4.5 5 5 7  Rapid3 (of 30) 8.8 10.3 7.7 10.8 10.8 8.8 10.3   04/02/2023  10:11 AM 09/26/2022  10:28 AM 05/03/2022   9:29 AM 09/22/2021  11:26 PM 06/10/2021  11:36 AM 09/10/2020   3:05 PM 03/26/2020   7:57 AM Global Health Scale Score Global Physical Health Raw Score: 14 14 13 13 13 16 15  Global Physical Health T-Score: 37.4 37.4 34.9 34.9 34.9 39.8 37.4 Global Mental Health Raw Score:  16 17 16 15 16 19 17  Global Mental Health T-Score: 53.3 56 53.3 50.8 53.3 62.5 56 Calculated EQ-5D: 0.76 0.77 0.69 0.67 0.69 0.81 0.79 Assessment/PlanCOVID STATUS She declines  further Covid vaccines and further discussion.FIBROMYALGIA She takes  Effexor 150 mg  2 po QD &  Neurontin 900 mg 3 hours before bed, and  both are prescribed  by this office Failed Cymbalta, Ultram as above.   She is improved on this regimen.Her Neurontin was refilled 03/15/2023  for 90 days.NECK PAINShe has neck pain and trap spasm when she is in the car.She was advise to try ice and heat and consider using a lidocaine or an OTC patch on her trap. LOWER BACK PAIN  She saw Dr Jackquline Bosch on 11/12/2021 and was told she had age related wear and tear.He did not recommend surgeryPrevious XRs showed moderately severe DDD at L 2-S 2, with severe changes at L5-S1.  She has had some lower back pain She did not like how she felt on  tizanidineShe is currently on  Neurontin 900  mg 3 hours before bed from this office at 7 PM every night. She may be a candidate for facet or epidural injectionsShe has tolerable pain today. 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 She has failed  Aspercreme and Aspercreme with lidocaine. She had mild DJD on her hips.POSSIBLE MERALGIA PARESTHETICAShe has noted a rare and very intermittent strange sensation in her right thigh.She may need a NCS if it recurs and stays.POSSIBLE RLS She notes her symptoms of restless legs improve with hydration.She has not discussed this with her PCP.She  continues gabapentin QPM HLA B 27 + Nothing to suggest ankylosing spondylitis She has no synovitis.A bone scan was negative in 2014There is no indication to repeat the bone scan at this timeKNEE PAINShe has intermittent soreness with stairs.She was  advised to focus on weight loss and has lost 4 lbs She is better with using a hand rail to get up stairs.She lives in a single floor dwelling.  HEALTH MAINTENANCE Colonoscopy May 24 2017Had a CPE on 05/06/2022.Mammogram was due 4/4/2024CPE  05/17/2023  and plans to move it until 10/2024WEIGHT She has  lost weight since her last visit She is down close to losing 30 lbs in the past few years.She was advised to continue to walk and also to lower her  caloric in-take and increase her activity level.BONE PAIN BMD was normal 10/2018.Her vitamin D was 47 on 03/31/2023. Follow up [place] MDScribed for Elenor Quinones, MD by Hansel Feinstein, medical scribe April 05, 2023 The documentation recorded by the scribe accurately reflects the services I personally performed and the decisions made by me. I reviewed and confirmed all material entered and/or pre-charted by the scribe.Elenor Quinones, MDElectronically Signed by Elenor Quinones, MD, April 05, 2023

## 2023-04-26 ENCOUNTER — Encounter: Admit: 2023-04-26 | Payer: PRIVATE HEALTH INSURANCE | Attending: Rheumatology

## 2023-04-29 ENCOUNTER — Encounter: Admit: 2023-04-29 | Payer: PRIVATE HEALTH INSURANCE

## 2023-04-29 MED ORDER — VENLAFAXINE IMMEDIATE RELEASE 75 MG TABLET
75 | ORAL_TABLET | Freq: Every day | ORAL | 1 refills | Status: AC
Start: 2023-04-29 — End: ?

## 2023-04-29 NOTE — Telephone Encounter
Last visit: 7/29/2024Next visit: 09/30/2023 & 7/15/2025Last labs:Lab Results Component Value Date  WBC 5.5 03/31/2023  HGB 14.1 03/31/2023  HCT 42.3 03/31/2023  MCV 97.0 03/31/2023  MCH 32.3 03/31/2023  MCHC 33.3 03/31/2023  PLT 318 03/31/2023  MPV 9.5 03/31/2023  NEUTROPHILS 54 03/31/2023  LYMPHOCYTES 34.0 03/31/2023  MONOCYTES 8.2 03/31/2023  EOSINOPHILS 3.1 03/31/2023  Lab Results Component Value Date  NA 139 03/31/2023  K 4.5 03/31/2023  CL 105 03/31/2023  CO2 25 03/31/2023  GLU 99 03/31/2023  BUN 12 03/31/2023  CREATININE 0.88 03/31/2023  EGFR >60 02/14/2013  EGFRAFRAMER 91 11/26/2020  CALCIUM 9.4 03/31/2023  ALBUMIN 4.3 03/31/2023  PROT 7.0 12/28/2018  BILITOT 0.6 03/31/2023  ALKPHOS 48 03/31/2023  ALT 17 03/31/2023  AST 16 03/31/2023  GLOB 2.8 03/31/2023  Lab Results Component Value Date  CRPQ 5.3 03/31/2023  SEDRATE 9 03/31/2023

## 2023-06-08 ENCOUNTER — Telehealth: Admit: 2023-06-08 | Payer: PRIVATE HEALTH INSURANCE | Attending: Surgery

## 2023-06-08 DIAGNOSIS — Z1509 Genetic susceptibility to other malignant neoplasm: Secondary | ICD-10-CM

## 2023-06-08 NOTE — Telephone Encounter
 Correct order sent to Dr. Wallene Dales to sign

## 2023-06-08 NOTE — Telephone Encounter
 Copied from CRM #[id number]. Topic: [facility] General CRM - General Inquiry>> Jun 08, 2023 10:03 AM Jai'Mice T wrote:Radiology called regarding order change for [place]. Please change [place] to routine  abd and pelvis w/IV per [facility] [phone]

## 2023-06-09 ENCOUNTER — Inpatient Hospital Stay: Admit: 2023-06-09 | Discharge: 2023-06-09 | Payer: PRIVATE HEALTH INSURANCE

## 2023-06-09 DIAGNOSIS — Z1509 Genetic susceptibility to other malignant neoplasm: Secondary | ICD-10-CM

## 2023-06-09 MED ORDER — SODIUM CHLORIDE 0.9 % LARGE VOLUME SYRINGE FOR AUTOINJECTOR
Freq: Once | INTRAVENOUS | Status: CP | PRN
Start: 2023-06-09 — End: ?
  Administered 2023-06-09: 16:00:00 via INTRAVENOUS

## 2023-06-09 MED ORDER — IOHEXOL 350 MG IODINE/ML INTRAVENOUS SOLUTION
350 | Freq: Once | INTRAVENOUS | Status: CP | PRN
Start: 2023-06-09 — End: ?
  Administered 2023-06-09: 16:00:00 350 mL via INTRAVENOUS

## 2023-06-16 ENCOUNTER — Encounter: Admit: 2023-06-16 | Payer: PRIVATE HEALTH INSURANCE | Attending: Surgical

## 2023-06-16 ENCOUNTER — Ambulatory Visit: Admit: 2023-06-16 | Payer: PRIVATE HEALTH INSURANCE | Attending: Surgical

## 2023-06-16 DIAGNOSIS — L821 Other seborrheic keratosis: Secondary | ICD-10-CM

## 2023-06-16 DIAGNOSIS — D3610 Benign neoplasm of peripheral nerves and autonomic nervous system, unspecified: Secondary | ICD-10-CM

## 2023-06-16 DIAGNOSIS — M1990 Unspecified osteoarthritis, unspecified site: Secondary | ICD-10-CM

## 2023-06-16 DIAGNOSIS — M722 Plantar fascial fibromatosis: Secondary | ICD-10-CM

## 2023-06-16 DIAGNOSIS — M6530 Trigger finger, unspecified finger: Secondary | ICD-10-CM

## 2023-06-16 DIAGNOSIS — Z1509 Genetic susceptibility to other malignant neoplasm: Secondary | ICD-10-CM

## 2023-06-16 DIAGNOSIS — Q825 Congenital non-neoplastic nevus: Secondary | ICD-10-CM

## 2023-06-16 DIAGNOSIS — A6920 Lyme disease, unspecified: Secondary | ICD-10-CM

## 2023-06-16 DIAGNOSIS — I889 Nonspecific lymphadenitis, unspecified: Secondary | ICD-10-CM

## 2023-06-16 NOTE — Progress Notes
 Follow Up NoteChief Complaint:  The patient is a 62 y.o. female with HLRCCHistory of Present Illness:  Laurie White is a 62 y.o. 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 Her mother had metastatic HLRCC and passed away from Laurie White in 2016/07/12. She is feeling well without issuesFifty throughout female with HLRCC with history of fibroids and cutaneous leiomyomas.  Her mother passed away from metastatic RC.  Laurie White children were tested negative for a germ line mutation however her sister's son who was in college tested positive for a germ line mutation in [facility] is doing well without any issues.  She did see a dermatologist did have 1 of her skin lesions treated in the past year. She has been seen by Laurie White since 07/12/13 and subsequently Laurie White. The patient presents with no issues. [place] abdomen and pelvis with and without contrast on 03/06/2019 is negative for a renal mass, stone, or hydronephrosis.Had renal US on 03/18/2020 which showed no evidence of renal mass. Renal US on 03/02/2022 showed no renal mass. The patient is doing well and has no complaints. The patient has no gross hematuria, dysuria, or pain, and denies fevers, chills, nausea, emesis, or diarrhea.Recent [place] shows no masses, incidental finding of appendiceal thickening. Has had ongoing diarrhea but improving. No RLQ pain. [place] ABDOMEN PELVIS W IV CONTRAST on 06/09/2023 12:20 PM INDICATION: Hereditary Leiomyomatosis and renal cell carcinoma. COMPARISON: [place] abdomen and pelvis 03/25/2021 TECHNIQUE: [place] images of the abdomen and pelvis were obtained after the administration of intravenous contrast. IV contrast administered:  80 ML IOHEXOL (OMNIPAQUE) 350 MG IODINE/ML INJECTION Technical limitations: None. FINDINGS: Lung bases: Visualized lung bases are clear. Hepatobiliary: Stable punctate hepatic hypodensity too small to characterize. Mild focal fatty infiltration along the falciform ligament. Cholelithiasis. Pancreas: Unremarkable. Spleen: Unremarkable. Adrenal glands: Unremarkable. Kidneys: Homogenous enhancing bilateral kidneys without discrete lesion. No hydronephrosis. Bowel: No bowel obstruction. Colonic diverticulosis without evidence of acute diverticulitis. Interval somewhat thickened appearance of the appendix measuring 0.8 cm. No periappendiceal fat stranding or fluid collection. No intraluminal air density or fecalith. Mild lipomatosis of the ileocecal valve. Abdominal and pelvic lymph nodes: No lymphadenopathy. Peritoneum: No ascites. Vasculature: No abdominal aortic aneurysm.  Pelvis: Total abdominal hysterectomy. Stable 1.1 cm soft tissue density within the right inguinal canal.. Both ovaries not discretely visualized from the adjacent bowel loops Musculoskeletal system and soft tissue: No aggressive osseous lesion. Multilevel degenerative changes of the visualized spine, similar to prior exam. IMPRESSION:1.         Homogeneously enhancing kidneys; no renal mass.2.         Nonspecific thickened appearance of the appendix; no surrounding inflammatory changes. Correlate clinically to exclude symptoms of possible appendicitis and correlation with screening colonoscopic findings for the appendicular orifice.Medical History:Past Medical History: Diagnosis Date  Birth mark    lips left breast , right side of neck  Lyme disease July 12, 2008  Lyme in child hood and in Jul 12, 2008  Lymphadenitis 2010-07-12  Right side of neck , Jul 12, 2010 - Keflex and Septra  Neuroma   foot?  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  or radiculopathy 09/27/2018  Cervicalgia 09/27/2018  Low back pain, unspecified back pain laterality, unspecified chronicity, unspecified whether sciatica present 09/27/2018  Long term (current) use of non-steroidal anti-inflammatories (nsaid) 09/27/2018  Lesion of ulnar nerve, right upper limb 09/27/2018  Hyperlipidemia, unspecified hyperlipidemia type 09/27/2018  Carpal tunnel syndrome, unspecified laterality 09/27/2018  Fibromyalgia 09/27/2018  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) 02/14/2013 Surgical History:Past Surgical History: Procedure Laterality Date  BUNIONECTOMY Left 1989  CARPAL TUNNEL RELEASE Left 09/20/2019  CARPAL TUNNEL RELEASE Right 11/08/2019  CESAREAN SECTION  96, 98  FOOT SURGERY    numerous  MYOMECTOMY  12/1992  TOE SURGERY Bilateral   Toe surgeries in 1980s both feet  TONSILLECTOMY  1972  age 56  TOTAL ABDOMINAL HYSTERECTOMY  2000  one ovary remains 2000 due to Fibroids  TRIGGER FINGER RELEASE Left 09/20/2019  at time of [facility], left middle finger release  TRIGGER FINGER RELEASE Right 11/08/2019  Rigt index , and middle finger  UTERINE FIBROID SURGERY  1984  Fibroid  uterus 1984 removed large Fibroid Family History:Family History Problem Relation Age of Onset  Kidney cancer Mother   Spondyloarthropathy Mother   Irritable bowel syndrome Mother       Colitis  Rheum arthritis Sister   Rheum arthritis Paternal Aunt   Osteoarthritis Paternal Aunt   Osteoporosis Paternal Aunt  History reviewed. No pertinent family history of urologic malignancy.Social History:Social History Socioeconomic History  Marital status: Married Tobacco Use  Smoking status: Never  Smokeless tobacco: Never Substance and Sexual Activity  Alcohol use: Yes   Alcohol/week: 4.0 standard drinks of alcohol   Types: 4 Glasses of wine per week  Drug use: No  Medications:Current Outpatient Medications on File Prior to Visit Medication Sig Dispense Refill  calcium carbonate/vitamin D3 (VITAMIN D-3 ORAL) Take by mouth. Take 2000 mg iu    ferrous sulfate (IRON ORAL) Take by mouth.    gabapentin (NEURONTIN) 300 mg capsule Take 3 capsules (900 mg total) by mouth nightly. Take 3 hours before bed. 90 capsule 2  gabapentin (NEURONTIN) 300 mg capsule Take 3 capsules (900 mg total) by mouth nightly. Take 3 hours before bed. 270 capsule 0  levothyroxine (SYNTHROID, LEVOTHROID) 50 MCG tablet 1 tablet (50 mcg total) daily.  0  tiZANidine (ZANAFLEX) 2 mg tablet TAKE 2 TABLETS (4 MG TOTAL) BY MOUTH NIGHTLY AS NEEDED. FOR BACK PAIN 60 tablet 2  venlafaxine (EFFEXOR) 75 mg Immediate Release tablet Take 2 tablets (150 mg total) by mouth daily. 180 tablet 0 No current facility-administered medications on file prior to visit. Allergies:No Known AllergiesReview of Systems: (ROS was reviewed since prior visit)As per HPIPhysical Exam:Ht 5' 8 (1.727 m)  - Wt 89.4 kg  - BMI 29.95 kg/m? ECOG: 0PAIN ASSESSMENT: 0General Appearance: Alert, cooperative, no distress, well developed,			well nourished, appears stated ageHead: Normocephalic, without obvious abnormality, atraumaticEyes: Conjunctiva and lids within normal limitsEars, Nose, Mouth: External appearance within normal limits, hearing grossly intact Neck: Supple, normal range of motionRespiratory: Non laboredAbdomen: Soft, NT/ND, no guarding, no rebound, no palpable massesBack: No CVA tenderness, no spine tendernessSkin: Color, turgor normal, no visible rashes, lesionsMusculoskeletal: Intact sensation, normal range of motionExtremities: Extremities normal, no cyanosis or edemaPsychiatric: Alert/oriented x 3, affect within normal limits, mood within normal limitsNeurological: Gait stable for age, no focal defects. motor exam grossly intactImaging Studies: [place] ABDOMEN PELVIS W WO IV CONTRAST on 03/25/2021 12:06 PM INDICATION: History of HLRCC. COMPARISON: Ultrasound 03/18/2020, [place] 03/06/2019 TECHNIQUE:  images of the abdomen and pelvis were obtained before and after  the administration of 100 cc intravenous contrast (Omnipaque 350). FINDINGS:Lung bases: Visualized lung bases are clear. Hepatobiliary: Cholelithiasis. Pancreas: Unremarkable. Spleen: Unremarkable. Adrenal glands: Unremarkable. Kidneys: No renal mass.   Bowel: No bowel obstruction. Small hiatal hernia. Abdominal and pelvic lymph nodes: No lymphadenopathy. Peritoneum: No ascites. Pelvis: No mass. Hysterectomy. Unchanged rounded structure in the right inguinal canal. Musculoskeletal system and soft tissue: No aggressive osseous lesion. IMPRESSION:No renal mass. Reported And Signed By: Denton Ar, MD  [facility] [address] [facility] Radiology and Biomedical ImagingLaboratory: N/APathology: N/AAssessment and Plan: The patient is a 32F  with a history of HLRCC undergoing ongoing kidney surveillance. We reviewed her diagnosis and management history to date including recent renal US from June 2023. . Will continue surveillance consisting of kidney imaging. We have been alternating between renal US and [place] as she wishes to avoid MRI due to claustrophobia. Will order [place] in 1 year and follow up after.Discussed incidental finding of appendiceal thickening will send to PCP for further work up

## 2023-08-29 ENCOUNTER — Encounter: Admit: 2023-08-29 | Payer: PRIVATE HEALTH INSURANCE | Attending: Rheumatology

## 2023-08-31 MED ORDER — VENLAFAXINE IMMEDIATE RELEASE 75 MG TABLET
75 | ORAL_TABLET | ORAL | 1 refills | Status: AC
Start: 2023-08-31 — End: ?

## 2023-08-31 NOTE — Telephone Encounter
Last visit: 7/29/2024Next visit: 09/30/2023 & 7/15/2025Last labs:Lab Results Component Value Date  WBC 5.5 03/31/2023  HGB 14.1 03/31/2023  HCT 42.3 03/31/2023  MCV 97.0 03/31/2023  MCH 32.3 03/31/2023  MCHC 33.3 03/31/2023  PLT 318 03/31/2023  MPV 9.5 03/31/2023  NEUTROPHILS 54 03/31/2023  LYMPHOCYTES 34.0 03/31/2023  MONOCYTES 8.2 03/31/2023  EOSINOPHILS 3.1 03/31/2023  Lab Results Component Value Date  NA 139 03/31/2023  K 4.5 03/31/2023  CL 105 03/31/2023  CO2 25 03/31/2023  GLU 99 03/31/2023  BUN 12 03/31/2023  CREATININE 0.88 03/31/2023  EGFR >60 02/14/2013  EGFRAFRAMER 91 11/26/2020  CALCIUM 9.4 03/31/2023  ALBUMIN 4.3 03/31/2023  PROT 7.0 12/28/2018  BILITOT 0.6 03/31/2023  ALKPHOS 48 03/31/2023  ALT 17 03/31/2023  AST 16 03/31/2023  GLOB 2.8 03/31/2023  Lab Results Component Value Date  CRPQ 5.3 03/31/2023  SEDRATE 9 03/31/2023

## 2023-09-02 ENCOUNTER — Encounter: Admit: 2023-09-02 | Payer: PRIVATE HEALTH INSURANCE | Attending: Rheumatology

## 2023-09-04 LAB — CBC AND DIFFERENTIAL
BASOPHILS ABSOLUTE COUNT: 42 {cells}/uL (ref 0–200)
BASOPHILS: 0.7 %
EOSINOPHILS ABSOLUTE COUNT: 132 {cells}/uL (ref 15–500)
EOSINOPHILS: 2.2 %
HEMATOCRIT BLOOD: 36 % (ref 35.0–45.0)
HEMOGLOBIN: 12 g/dL (ref 11.7–15.5)
LYMPHOCYTES ABSOLUTE COUNT: 2334 {cells}/uL (ref 850–3900)
LYMPHOCYTES: 38.9 %
MCH: 32.4 pg (ref 27.0–33.0)
MCHC-HEMOGLOBINOPATHY: 33.3 g/dL (ref 32.0–36.0)
MCV: 97.3 fL (ref 80.0–100.0)
MONOCYTES ABSOLUTE COUNT: 354 {cells}/uL (ref 200–950)
MONOCYTES: 5.9 %
MPV: 9.5 fL (ref 7.5–12.5)
NEUTROPHILS ABSOLUTE COUNT: 3138 {cells}/uL (ref 1500–7800)
NEUTROPHILS: 52.3 %
PLATELET COUNT: 326 10*3/uL (ref 140–400)
RDW: 12.3 % (ref 11.0–15.0)
RED BLOOD CELL COUNT: 3.7 10*6/uL — ABNORMAL LOW (ref 3.80–5.10)
WHITE BLOOD CELL COUNT: 6 10*3/uL (ref 3.8–10.8)

## 2023-09-04 LAB — COMPREHENSIVE METABOLIC PANEL
ALBUMIN/GLOBULIN RATIO: 2 (calc) (ref 1.0–2.5)
ALBUMIN: 4.5 g/dL (ref 3.6–5.1)
ALKALINE PHOSPHATASE: 43 U/L (ref 37–153)
ALT (SGPT): 14 U/L (ref 6–29)
AST (SGOT): 15 U/L (ref 10–35)
BILIRUBIN, TOTAL: 0.5 mg/dL (ref 0.2–1.2)
BLOOD UREA NITROGEN: 13 mg/dL (ref 7–25)
CALCIUM: 9 mg/dL (ref 8.6–10.4)
CHLORIDE: 103 mmol/L (ref 98–110)
CO2: 28 mmol/L (ref 20–32)
CREATININE: 0.86 mg/dL (ref 0.50–1.05)
EGFR, CREATININE (CKD-EPI 2021) QUEST: 77 mL/min/{1.73_m2} (ref >=60)
GLOBULIN: 2.3 g/dL (ref 1.9–3.7)
GLUCOSE: 89 mg/dL (ref 65–139)
POTASSIUM: 4 mmol/L (ref 3.5–5.3)
PROTEIN, TOTAL, SPEP: 6.8 g/dL (ref 6.1–8.1)
SODIUM: 139 mmol/L (ref 135–146)

## 2023-09-04 LAB — VITAMIN D, 25-HYDROXY: VITAMIN D, 25 OH, TOTAL: 50 ng/mL (ref 30–100)

## 2023-09-04 LAB — C-REACTIVE PROTEIN     (CRP): C-REACTIVE PROTEIN: 7.2 mg/L (ref <8.0)

## 2023-09-04 LAB — SEDIMENTATION RATE (ESR): SED RATE BY MODIFIED WESTERGREN: 14 mm/h (ref <=30)

## 2023-09-16 ENCOUNTER — Encounter: Admit: 2023-09-16 | Payer: PRIVATE HEALTH INSURANCE | Attending: Rheumatology

## 2023-09-16 DIAGNOSIS — M797 Fibromyalgia: Secondary | ICD-10-CM

## 2023-09-17 MED ORDER — GABAPENTIN 300 MG CAPSULE
300 | ORAL_CAPSULE | Freq: Every evening | ORAL | 1 refills | Status: AC
Start: 2023-09-17 — End: ?

## 2023-09-17 MED ORDER — GABAPENTIN 300 MG CAPSULE
300 | ORAL_CAPSULE | Freq: Every evening | ORAL | 3 refills | Status: AC
Start: 2023-09-17 — End: ?

## 2023-09-17 NOTE — Telephone Encounter
 Duplicate

## 2023-09-17 NOTE — Telephone Encounter
 Last visit: 7/29/24Next visit: 1/23/25Last labs:Lab Results Component Value Date  WBC 6.0 09/03/2023  HGB 12.0 09/03/2023  HCT 36.0 09/03/2023  MCV 97.3 09/03/2023  MCH 32.4 09/03/2023  MCHC 33.3 09/03/2023  PLT 326 09/03/2023  MPV 9.5 09/03/2023  NEUTROPHILS 52.3 09/03/2023  LYMPHOCYTES 38.9 09/03/2023  MONOCYTES 5.9 09/03/2023  EOSINOPHILS 2.2 09/03/2023  Lab Results Component Value Date  NA 139 09/03/2023  K 4.0 09/03/2023  CL 103 09/03/2023  CO2 28 09/03/2023  GLU 89 09/03/2023  BUN 13 09/03/2023  CREATININE 0.86 09/03/2023  EGFR >60 02/14/2013  EGFRAFRAMER 91 11/26/2020  CALCIUM 9.0 09/03/2023  ALBUMIN 4.5 09/03/2023  PROT 7.0 12/28/2018  BILITOT 0.5 09/03/2023  ALKPHOS 43 09/03/2023  ALT 14 09/03/2023  AST 15 09/03/2023  GLOB 2.3 09/03/2023  Lab Results Component Value Date  SEDRATE 14 09/03/2023

## 2023-09-30 ENCOUNTER — Encounter: Admit: 2023-09-30 | Payer: PRIVATE HEALTH INSURANCE | Attending: Rheumatology

## 2023-09-30 VITALS — BP 130/79 | HR 78 | Wt 205.3 lb

## 2023-09-30 DIAGNOSIS — R7982 Elevated C-reactive protein (CRP): Secondary | ICD-10-CM

## 2023-09-30 DIAGNOSIS — M47812 Spondylosis without myelopathy or radiculopathy, cervical region: Secondary | ICD-10-CM

## 2023-09-30 DIAGNOSIS — M2550 Pain in unspecified joint: Secondary | ICD-10-CM

## 2023-09-30 DIAGNOSIS — M797 Fibromyalgia: Secondary | ICD-10-CM

## 2023-09-30 DIAGNOSIS — Z780 Asymptomatic menopausal state: Secondary | ICD-10-CM

## 2023-09-30 DIAGNOSIS — M5416 Radiculopathy, lumbar region: Secondary | ICD-10-CM

## 2023-09-30 DIAGNOSIS — M25551 Pain in right hip: Secondary | ICD-10-CM

## 2023-09-30 DIAGNOSIS — M8589 Other specified disorders of bone density and structure, multiple sites: Secondary | ICD-10-CM

## 2023-09-30 DIAGNOSIS — E559 Vitamin D deficiency, unspecified: Secondary | ICD-10-CM

## 2023-09-30 DIAGNOSIS — Z1589 Genetic susceptibility to other disease: Secondary | ICD-10-CM

## 2023-09-30 MED ORDER — LEVOTHYROXINE 75 MCG TABLET
75 | Freq: Every day | ORAL | Status: AC
Start: 2023-09-30 — End: 2023-09-30

## 2023-09-30 NOTE — Progress Notes
 [facility] [place], [place], [address]UEAVW: [phone]  Fax: [phone] Dyett White, MDSonia, Laurie White, MDKristin Vassie Loll PA-CFace to faceHistory of present Laurie White is a 63 y.o. female with fibromyalgia and chronic joint pain in the setting of a normal ESR, CRP, and negative serologies and bone scan, she presents for a follow-up visit.  Last seen 07/29/2024History [name] presented on 10/31/2012 as a 63 year old with a strong family history of inflammatory arthritis in her mother and sister Laurie White), who was well until her early 59s when she developed initial symptoms of numbness in her hands and pain in her fingers. She came for a rheumatology consultation for joint pain. 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 She notes she saw a specialist several years ago, whom she believes was a neurologist. Nerve conduction studies indicated carpal tunnel syndrome of unknown severity, and she was advised to wear wrist splints.She experienced stiffness in her fingers, with soreness in her hands and knuckles. She also noted pain in her DIP joints. She saw rheumatologist Laurie Fleeting, MD, who prescribed Mobic and followed her care for 3 years. More recently, she has been seeing Laurie Meuse, MD, for the past 2 years but notes she will not return to that office.She has experienced stiffness in her neck, pain in her ankles, feet, fingers, wrists, and hips. The worst stiffness lasts for 10 minutes in the mornings, but she has stiffness throughout the day.  Her last appointment with Dr. Reita White was on 09/13/2012, where she was prescribed Prednisone 10 mg PO QAM. She noted a 20 lbs weight gain over the past year.While on Prednisone, she experienced marked improvement with less joint pain. However, she expressed concern about hearing a cracking sound in her neck and still has persistent stiffness in her ankles and hips, which remain sore and stiff.She mentioned, I am not a good pill taker.In addition, she was diagnosed with plantar fasciitis and possible Morton?s neuroma by Laurie White, DPM, who gave her a left heel injection in 2012. She has improved since the injection and with the use of orthotics. She was also diagnosed with fibromyalgia.She had a bone scan and lab tests. She was eventually taken off Prednisone and diagnosed with non-inflammatory polyarthralgia. Labs, X-rays, and a bone scan showed very little to suggest inflammatory arthritis. 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 Negative serologies, including RF and CCP, were noted.She was advised that she could still develop spondyloarthropathy as she is HLA-B27 positive. A bone scan was negative for inflammatory arthritis.For treatment, she failed Cymbalta and Neurontin, but was transitioned to Effexor with good results.Immunization History Administered Date(s) Administered  COVID-19 Vaccine - PFIZER 12/26/2019, 01/16/2020  Interim history10/05/2023: She was seen by Laurie Linger, PA, Urology for follow up of hereditary leiomyomatosis and family history of renal cell cancer. Her  [place] abdomen was reviewed with the patient. The patient was doing well without any complaints. The patient had no gross hematuria, or pain. She denied any fevers, chills, nausea, emesis, or diarrhea. She was advised on continuing surveillance consisting of kidney imaging. She wished to avoid MRI due to claustrophobia. Her [place] was ordered to be taken in a year and follow up after that. They discussed that the incidental finding of appendiceal thickening will  be send to her PCP for further work up. 07/09/2023: She was seen by Laurie Blanco, MD, Orthopedics Surgery, for right shoulder pain. She had a fall and  slipped landing on a right bent elbow. Pressure went up to her shoulder and then it was not normalized at that time. She reported pain with certain movements. Her exam showed evidence of supraspinatus injury consistent with her mechanism. She was advised to start formal therapy and advised that her pain will subside in 3 months, or else she will be planned for an MRI and White require repair of her rotator cuff. 09/30/2023: She was seen by Laurie Kinsman, APRN, Family Medicine, for physical exam visit. Mammogram and other labs were ordered pertaining to her age and condition. She was advised to continue on current medication regimen. Social history Married to Laurie White who is retired.  They are living in [place]. She has been there since April 2022.  Daughter Laurie White, 26 (2/9), works in the [employer] system as a Pediatric MSW. Laurie White is engaged to be married to her girlfriend.  Son Laurie White, 28 (7/23), is in Counselling psychologist at [employer] in [place] and has been married since 10/31/2022. Her son and future daughter-in-law, Laurie White Laurie White has her master's in Art History and works for [employer]), live 14 hours away.  She notes her son and daughter-in-law plan to relocate to the [facility].  Visited her sister Laurie White yesterday.  [place]. She is here for a week.She is [place] every 4-6 weeks to  to see her sister.[name] lives in [place] Medication historyShe has failed a number of medications, including Savella, Ultram, Neurontin, Cymbalta, Etodolac, Lodine, Mobic, and could not afford Celebrex.Healthcare team PCP: Laurie Kinsman, APRN in the office of Laurie Sia, MD, Laurie White, [place]  Urology: Laurie Goody, MD  Ortho ([place]): Laurie Gosling, MD  Podiatry: Laurie White, DPMFamily history She has a strong family history of inflammatory arthritis in her mother and sister Laurie White).  She lost her mother in June 2016 from renal cancer.  Her children are negative for the gene.Current Outpatient Medications Medication Sig  calcium carbonate/vitamin D3 (VITAMIN D-3 ORAL) Take by mouth. Take 2000 mg iu  ferrous sulfate (IRON ORAL) Take by mouth.  gabapentin Take 3 capsules (900 mg total) by mouth nightly. Take 3 hours before bed.  gabapentin Take 3 capsules (900 mg total) by mouth nightly. Take 3 hours before bed.  levothyroxine 1 tablet (50 mcg total) daily.  tiZANidine TAKE 2 TABLETS (4 MG TOTAL) BY MOUTH NIGHTLY AS NEEDED. FOR BACK PAIN  venlafaxine Take 2 tablets (150 mg total) by mouth daily. No current facility-administered medications for this visit. No Known Allergies Review of Systems (negative except as noted; positive findings bold)Constitutional: fatigue, malaise, fever, chills, sweats, weakness, feeling sickly, swollen glands, weight loss, weight gain, weight changes.Current weight 205 lbs up from 201 lbs. Peak 220 lbs Eye : Eye pain, eye redness, dry eye, loss of vision, double vision, blurry vision ENT : Ringing in the ears, hearing loss, nose bleeds, dry nose, dry mouth, mouth sores, loss of taste, hoarseness, problems with smell, lump in throat, stuffy nose, post-nasal drip Cardiac : Chest pain, irregular heart beat, palpitations, murmur, fainting spells (syncope), swollen legs or feetResp: Shortness of breath, difficulty breathing at night, cough, wheezingGI : Nausea, vomiting, vomiting of blood or coffee grounds, abdominal pain, constipation, diarrhea, blood in stools, black stools, heart burn, loss of appetite, trouble swallowingGenitourinary: Difficult urination, pain or burning on urination, blood in urine, cloudy/smoky urine,  Musculoskeletal: Morning stiffnessJoint pain, Joint swelling, muscle weakness, muscle tenderness, muscle spasm, neck pain, lower back painSkin: Easy bruising, rash, psoriasis, hives, skin tightness,  hair lossRaynaud's: Color changes of hands and feet in the coldNeurology: Headaches, dizziness, problems with memory, loss of balance, numbness, tingling, problems with thinkingPsychiatric: anxiety, depression, problems with social activitiesSleep: Difficulty falling asleep, difficulty staying asleepEndocrine: Excessive thirst BP 130/79 (Site: l a, Position: Sitting, Cuff Size: Medium)  - Pulse 78  - Wt 93.1 kg  - BMI 31.22 kg/m?  Physical ExamGENERAL: alert and oriented X 3, HEENT:   no alopecia,  conjunctiva moist, Large varicosities in her tongue, and on her lips ( Purple discoloration-congenital) NECK: Hemangioma , right side of neck , No  axillary adenopathy  LUNGS: clear  COR: regular rate and rhythm, SKIN:  as aboveVASCULAR: temporal, radial, dp and posterior tibial arteries pulsatile. No clubbing or edema is present.  Large varicosities ( head and neck)    NEUROLOGIC: normoactive symmetric reflexes, no sensory deficits, Positive L>>>R Tinel's test. MUSCULOSKELETAL:Muscle tone and strength are normal.   No kyphosis is noted.   She had mild decrease in ROM of her  neck &  No trap spasm +decrease in ROM of  lower back.     Para lumbar tenderness  On examination of her joints she had OA changes of her  DIPs, Resolved trigger finger 3rd finger left hand Normal PIPs & Normal MCPs   She had full ROM of her wrists, elbows & shoulders.   + Ulnar tinel'sShe had a mild decrease in  ROM of her hips. Non tender  right trochanteric bursa   Knees had FROM and there was no evidence of an effusion; she had no instability; no  varus or valgus deformities.       he had no acute synovitis of any joints.       She had paired positive paired tender points. Labs reviewed with patient Lab Results Component Value Date  WBC 6.0 09/03/2023  HGB 12.0 09/03/2023  HCT 36.0 09/03/2023  MCV 97.3 09/03/2023  MCH 32.4 09/03/2023  MCHC 33.3 09/03/2023  PLT 326 09/03/2023  MPV 9.5 09/03/2023 NEUTROPHILS 52.3 09/03/2023  LYMPHOCYTES 38.9 09/03/2023  MONOCYTES 5.9 09/03/2023  EOSINOPHILS 2.2 09/03/2023  Lab Results Component Value Date  NA 139 09/03/2023  K 4.0 09/03/2023  CL 103 09/03/2023  CO2 28 09/03/2023  GLU 89 09/03/2023  BUN 13 09/03/2023  CREATININE 0.86 09/03/2023  EGFR >60 02/14/2013  EGFRAFRAMER 91 11/26/2020  CALCIUM 9.0 09/03/2023  ALBUMIN 4.5 09/03/2023  PROT 7.0 12/28/2018  BILITOT 0.5 09/03/2023  ALKPHOS 43 09/03/2023  ALT 14 09/03/2023  AST 15 09/03/2023  GLOB 2.3 09/03/2023  Lab Results Component Value Date  SEDRATE 14 09/03/2023  SEDRATE 9 03/31/2023  SEDRATE 9 08/27/2022 [place] Abdomen Pelvis w IV Contrast on 06/09/2023: FINDINGS:Lung bases: Visualized lung bases are clear. Hepatobiliary: Stable punctate hepatic hypodensity too small to characterize. Mild focal fatty infiltration along the falciform ligament. Cholelithiasis. Pancreas: Unremarkable. Spleen: Unremarkable. Adrenal glands: Unremarkable. Kidneys: Homogenous enhancing bilateral kidneys without discrete lesion. No hydronephrosis. Bowel: No bowel obstruction. Colonic diverticulosis without evidence of acute diverticulitis. Interval somewhat thickened appearance of the appendix measuring 0.8 cm. No periappendiceal fat stranding or fluid collection. No intraluminal air density or fecalith. Mild lipomatosis of the ileocecal valve. Abdominal and pelvic lymph nodes: No lymphadenopathy. Peritoneum: No ascites. Vasculature: No abdominal aortic aneurysm.  Pelvis: Total abdominal hysterectomy. Stable 1.1 cm soft tissue density within the right inguinal canal.. Both ovaries not discretely visualized from the adjacent bowel loops Musculoskeletal system and soft tissue: No aggressive osseous lesion. Multilevel degenerative changes of the visualized spine, similar to prior  exam. IMPRESSION:1.Homogeneously enhancing kidneys; no renal mass.2.Nonspecific thickened appearance of the appendix; no surrounding inflammatory changes. Correlate clinically to exclude symptoms of possible appendicitis and correlation with screening colonoscopic findings for the appendicular orifice. Latest Reference Range & Units 09/05/21 11:50 12/10/21 10:34 03/06/22 11:45 05/07/22 11:28 08/27/22 10:02 CRP <8.0 mg/L 7.1 7.7 6.6 6.3 8.3 (H)  Latest Reference Range & Units 12/10/21 10:34 03/06/22 11:45 05/07/22 11:28 08/27/22 10:02 Sed Rate By Modified Westergren < OR = 30 mm/h 2 2 6 9   Latest Reference Range & Units 09/26/20 10:59 09/05/21 11:50 12/10/21 10:34 05/07/22 11:28 Cholesterol, Total <200 mg/dL    962 (H) HDL Cholesterol > OR = 50 mg/dL    77 LDL Cholesterol mg/dL (calc)    952 (H) Non-HDL Cholesterol <130 mg/dL (calc)    841 (H) Chol/HDL Ratio <5.0 (calc)    2.9 Iron, Total 45 - 160 mcg/dL 61   78 Iron Binding Capacity 250 - 450 mcg/dL (calc) 324   401 Ferritin 16 - 232 ng/mL 14 (L)   23 Creatine Kinase, Total 29 - 143 U/L    71 Vitamin D, 25 OH, Total 30 - 100 ng/mL  39 32 45 XR LUMBAR SPINE AP AND LATERAL 10/4/22Impression: Multilevel degenerative changes of the lower lumbar spine.XR HIPS BILATERAL AP LATERAL W AP PELVIS 10/4/22Impression: Mild degenerative changes involving bilateral hip joints.[place] ABDOMEN PELVIS W WO IV CONTRAST on 03/25/2021 12:06 PMImpression: No renal mass.03/06/19 XR Hips b/l AP lateral Normal Hips 03/06/2019   XR Lumbar Spine AP/ Lateral DDDThere is mild L3-4, moderate L4-5, and moderately severe L5-S1 degenerative disc disease with facet arthropathy in the lower lumbar spine.03/06/2019 [place] ABD/Pelvis W/WO IV ContrastClinical indications: History of hereditary leiomyomatosis and renal cell cancer syndromeImpresson: No evidence of renal mass, small left inguinal hernia Bone Density: Exam date 02/11/2020[place] BMD(g/cm2) T-score Z-score Classification AP Spine (L1-L4) 1.233  1.7  2.8 Normal Femoral Neck (Left) 0.943  0.8  1.9 Normal Total Hip (Left) 1.109  1.4  2.1 Normal  10-year Fracture Risk?: Major Osteoporotic Fracture 4.6% Hip Fracture < 0.1% 07/15/2016   BMD Right femur t score -0.7 L1-4  t score 1.7Left femur  -0.1Old data 06/29/2016 Lyme IgG WB  positive  11/26/2015  Urine Cx Calcium Oxalate BMD was normal in 04/13/2013 12/16/2012  Bone scan Mild uptake Right AC joint2/25/2014  ANA negRF neg at 6.20, CCP neg SPEP negParvovirus IgM , IgG negHep screen negACE 44 ( normal) HLA  B 27 +  09/29/2023   1:56 PM 04/02/2023  10:08 AM 09/26/2022  10:26 AM 05/03/2022   9:27 AM 09/22/2021  11:24 PM 06/10/2021  11:34 AM 09/10/2020   3:07 PM Rapid 3 Assessment Function 1  1 1 2 4 4 1  Pain 4  4.5  6  4  5   4.5  3.5  Patient Global 3  4  4  3   4.5  5  5   Rapid3 (of 30) 7.3  8.8 10.3 7.7 10.8 10.8 8.8   Patient-reported  Proxy-reported   04/02/2023  10:11 AM 09/26/2022  10:28 AM 05/03/2022   9:29 AM 09/22/2021  11:26 PM 06/10/2021  11:36 AM 09/10/2020   3:05 PM 03/26/2020   7:57 AM Global Health Scale Score Global Physical Health Raw Score: 14 14 13 13 13 16 15  Global Physical Health T-Score: 37.4 37.4 34.9 34.9 34.9 39.8 37.4 Global Mental Health Raw Score:  16 17 16 15 16 19 17  Global Mental Health T-Score: 53.3 56 53.3 50.8 53.3 62.5 56 Calculated EQ-5D:  0.76 0.77 0.69 0.67 0.69 0.81 0.79  Assessment/PlanCOVID STATUS She declines further COVID vaccines and further discussion.She was advised to get the new COVID Booster as it is now available.  FIBROMYALGIA She takes  Effexor 150 mg  2 po QD &  Neurontin 900 mg 3 hours before bed, and  both are prescribed  by this office Failed Cymbalta, Ultram as above.   She is feeling well on this regimen.Her Neurontin was refilled on 09/17/2023 for 90 days.  She has restorative sleep.  She will continue Effexor 150 mg, 2 PO QD, and Neurontin 900 mg, 3 hours before bed, as prescribed by this office.  THICKENING OF APPENDIX Her [place] on 06/09/2023, showed homogeneously enhancing kidneys; no renal mass. Nonspecific thickened appearance of the appendix; no surrounding inflammatory changes. Correlate clinically to exclude symptoms of possible appendicitis and correlation with screening colonoscopic findings for the appendicular orifice.She was advised to follow-up with gastroenterologist. NECK PAINShe has neck pain and trap spasmShe notes she is worse with the colder weather.She uses heat and did not get the  lidocaine patch.She uses a roll on AspercremeRIGHT SHOULDER PAIN She was seen by Laurie Blanco, MD, Orthopedics Surgery, for right shoulder pain on 07/09/2023. She  slipped on a slippery rock in [place].She had a fall and slipped landing on a right bent elbow. Pressure went up to her shoulder and then it was not normalized at that time. She reported pain with certain movements. Her exam showed evidence of supraspinatus injury consistent with her mechanism. She was advised to start formal therapy and advised that her pain will subside in 3 months, or else she will be planned for an MRI and White require repair of her rotator cuff. She still has elbow pain. She had Physical therapy for her RC. She had 10 weeks of PT. She did not require surgery.Her supraspinatus injury is resolving.LOWER BACK PAIN  She saw Dr Jackquline Bosch on 11/12/2021 and was told she had age related wear and tear.He did not recommend surgeryPrevious XRs showed moderately severe DDD at L 2-S 2, with severe changes at L5-S1.  She did not like how she felt on  tizanidineShe has not  facet or epidural injectionsShe rates her level of low back pain 2/10It will increase to 5-6/10 with vacuuming or lifting to put things away.She is currently on  Neurontin 900  mg 3 hours before bed from this office at 7 PM every night.She is tolerating it well. 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 She  continues gabapentin QPM HLA B 27 + Nothing to suggest ankylosing spondylitis She has no synovitis.A bone scan was negative in 2014 KNEE PAINShe has intermittent soreness with stairs.She was not injured in her fall. HEREDITARY LEIOMYOMATOSIS She was seen by Laurie Linger, PA, Urology for follow up of hereditary leiomyomatosis and family history of renal cell cancer on 06/16/2023. Her [place] abdomen was reviewed with the patient. The patient was doing well without any complaints. The patient had no gross hematuria, or pain.  She denied any fevers, chills, nausea, emesis, or diarrhea. She was advised on continuing surveillance consisting of kidney imaging. She wished to avoid MRI due to claustrophobia. Her [place] was ordered to be taken in a year and follow up after that. They discussed that the incidental finding of appendiceal thickening will  be send to her PCP for further work up. She has an appointment to see Laurie Linger, PA, Urology on 06/16/2024. FALL She fell in October 2024 and injured her right shoulder and hipShe has recovered. She slipped off a wet rock.She waited 2 weeks before she called and went to see Ortho. HEALTH MAINTENANCE Colonoscopy White 24 2017 [facility]2023 Laurie White APRNMammogram TBDWEIGHT Her current weight 205 lbs up from 201 lbs. She has gained 4 lbs since the last visit. She was advised to resume and continue her lower caloric in-take and increase her activity level.BONE PAIN BMD was normal 10/2018.Her vitamin D was 47 on 03/31/2023. Follow up [place] MDScribed for Elenor Quinones, MD by Hansel Feinstein, medical scribe September 30, 2023 The documentation recorded by the scribe accurately reflects the services I personally performed and the decisions made by me. I reviewed and confirmed all material entered and/or pre-charted by the scribe.Elenor Quinones, MDElectronically Signed by Elenor Quinones, MD, September 30, 2023

## 2023-11-11 ENCOUNTER — Inpatient Hospital Stay: Admit: 2023-11-11 | Discharge: 2023-11-11 | Payer: PRIVATE HEALTH INSURANCE

## 2023-11-11 DIAGNOSIS — Z13820 Encounter for screening for osteoporosis: Secondary | ICD-10-CM

## 2023-11-11 DIAGNOSIS — Z780 Asymptomatic menopausal state: Secondary | ICD-10-CM

## 2023-12-05 ENCOUNTER — Encounter: Admit: 2023-12-05 | Payer: PRIVATE HEALTH INSURANCE | Attending: Rheumatology

## 2023-12-06 MED ORDER — VENLAFAXINE IMMEDIATE RELEASE 75 MG TABLET
75 | ORAL_TABLET | ORAL | 1 refills | Status: AC
Start: 2023-12-06 — End: ?

## 2023-12-06 NOTE — Telephone Encounter
 Date of last visit: 1/23/25Date of next visit: 7/15/25Last labs:Lab Results Component Value Date  WBC 6.0 09/03/2023  HGB 12.0 09/03/2023  HCT 36.0 09/03/2023  MCV 97.3 09/03/2023  MCH 32.4 09/03/2023  MCHC 33.3 09/03/2023  PLT 326 09/03/2023  MPV 9.5 09/03/2023  NEUTROPHILS 52.3 09/03/2023  LYMPHOCYTES 38.9 09/03/2023  MONOCYTES 5.9 09/03/2023  EOSINOPHILS 2.2 09/03/2023  Lab Results Component Value Date  NA 139 09/03/2023  K 4.0 09/03/2023  CL 103 09/03/2023  CO2 28 09/03/2023  GLU 89 09/03/2023  BUN 13 09/03/2023  CREATININE 0.86 09/03/2023  EGFR >60 02/14/2013  EGFRAFRAMER 91 11/26/2020  CALCIUM 9.0 09/03/2023  ALBUMIN 4.5 09/03/2023  PROT 7.0 12/28/2018  BILITOT 0.5 09/03/2023  ALKPHOS 43 09/03/2023  ALT 14 09/03/2023  AST 15 09/03/2023  GLOB 2.3 09/03/2023  Lab Results Component Value Date  SEDRATE 14 09/03/2023

## 2023-12-15 ENCOUNTER — Encounter: Admit: 2023-12-15 | Payer: PRIVATE HEALTH INSURANCE | Attending: Rheumatology

## 2023-12-15 DIAGNOSIS — M797 Fibromyalgia: Secondary | ICD-10-CM

## 2023-12-16 ENCOUNTER — Encounter: Admit: 2023-12-16 | Payer: PRIVATE HEALTH INSURANCE

## 2023-12-16 MED ORDER — GABAPENTIN 300 MG CAPSULE
300 | ORAL_CAPSULE | Freq: Every evening | ORAL | 3 refills | Status: AC
Start: 2023-12-16 — End: ?

## 2023-12-16 NOTE — Telephone Encounter
 Last visit: 1/23/2025Next visit: 03/21/2024 & 1/26/2026RN LVM and sent [facility] to get updated labs drawn.Last labs:Lab Results Component Value Date  WBC 6.0 09/03/2023  HGB 12.0 09/03/2023  HCT 36.0 09/03/2023  MCV 97.3 09/03/2023  MCH 32.4 09/03/2023  MCHC 33.3 09/03/2023  PLT 326 09/03/2023  MPV 9.5 09/03/2023  NEUTROPHILS 52.3 09/03/2023  LYMPHOCYTES 38.9 09/03/2023  MONOCYTES 5.9 09/03/2023  EOSINOPHILS 2.2 09/03/2023  Lab Results Component Value Date  NA 139 09/03/2023  K 4.0 09/03/2023  CL 103 09/03/2023  CO2 28 09/03/2023  GLU 89 09/03/2023  BUN 13 09/03/2023  CREATININE 0.86 09/03/2023  EGFR >60 02/14/2013  EGFRAFRAMER 91 11/26/2020  CALCIUM 9.0 09/03/2023  ALBUMIN 4.5 09/03/2023  PROT 7.0 12/28/2018  BILITOT 0.5 09/03/2023  ALKPHOS 43 09/03/2023  ALT 14 09/03/2023  AST 15 09/03/2023  GLOB 2.3 09/03/2023  Lab Results Component Value Date  CRPQ 7.2 09/03/2023  SEDRATE 14 09/03/2023

## 2024-02-11 ENCOUNTER — Encounter: Admit: 2024-02-11 | Payer: PRIVATE HEALTH INSURANCE | Attending: Rheumatology

## 2024-02-11 DIAGNOSIS — M797 Fibromyalgia: Secondary | ICD-10-CM

## 2024-02-11 MED ORDER — GABAPENTIN 300 MG CAPSULE
300 | ORAL_CAPSULE | Freq: Every evening | ORAL | 3 refills | 30.00000 days | Status: AC
Start: 2024-02-11 — End: ?

## 2024-02-11 NOTE — Telephone Encounter
 Date of last visit:  1/23/25Date of next visit: 8/5/25Last labs:Lab Results Component Value Date  WBC 6.0 09/03/2023  HGB 12.0 09/03/2023  HCT 36.0 09/03/2023  MCV 97.3 09/03/2023  MCH 32.4 09/03/2023  MCHC 33.3 09/03/2023  PLT 326 09/03/2023  MPV 9.5 09/03/2023  NEUTROPHILS 52.3 09/03/2023  LYMPHOCYTES 38.9 09/03/2023  MONOCYTES 5.9 09/03/2023  EOSINOPHILS 2.2 09/03/2023  Lab Results Component Value Date  NA 139 09/03/2023  K 4.0 09/03/2023  CL 103 09/03/2023  CO2 28 09/03/2023  GLU 89 09/03/2023  BUN 13 09/03/2023  CREATININE 0.86 09/03/2023  EGFR >60 02/14/2013  EGFRAFRAMER 91 11/26/2020  CALCIUM 9.0 09/03/2023  ALBUMIN 4.5 09/03/2023  PROT 7.0 12/28/2018  BILITOT 0.5 09/03/2023  ALKPHOS 43 09/03/2023  ALT 14 09/03/2023  AST 15 09/03/2023  GLOB 2.3 09/03/2023  Lab Results Component Value Date  SEDRATE 14 09/03/2023

## 2024-02-22 ENCOUNTER — Encounter: Admit: 2024-02-22 | Payer: PRIVATE HEALTH INSURANCE | Attending: Rheumatology

## 2024-02-22 DIAGNOSIS — M797 Fibromyalgia: Secondary | ICD-10-CM

## 2024-03-05 ENCOUNTER — Encounter: Admit: 2024-03-05 | Payer: PRIVATE HEALTH INSURANCE | Attending: Rheumatology

## 2024-03-05 DIAGNOSIS — M797 Fibromyalgia: Principal | ICD-10-CM

## 2024-03-06 MED ORDER — VENLAFAXINE IMMEDIATE RELEASE 75 MG TABLET
75 | ORAL_TABLET | ORAL | 1 refills | 60.00000 days | Status: AC
Start: 2024-03-06 — End: ?

## 2024-03-06 MED ORDER — GABAPENTIN 300 MG CAPSULE
300 | ORAL_CAPSULE | Freq: Every evening | ORAL | 1 refills | 30.00000 days | Status: AC
Start: 2024-03-06 — End: ?

## 2024-03-06 NOTE — Telephone Encounter
 Last visit: 1/23/2025Next visit: 04/11/2024 & 1/26/2026RN spoke with pt to inform her to get updated labs done and pt said that she will try to go this week as she will be in [place]. Pt said thank you.Last labs:Lab Results Component Value Date  WBC 6.0 09/03/2023  HGB 12.0 09/03/2023  HCT 36.0 09/03/2023  MCV 97.3 09/03/2023  MCH 32.4 09/03/2023  MCHC 33.3 09/03/2023  PLT 326 09/03/2023  MPV 9.5 09/03/2023  NEUTROPHILS 52.3 09/03/2023  LYMPHOCYTES 38.9 09/03/2023  MONOCYTES 5.9 09/03/2023  EOSINOPHILS 2.2 09/03/2023  Lab Results Component Value Date  NA 139 09/03/2023  K 4.0 09/03/2023  CL 103 09/03/2023  CO2 28 09/03/2023  GLU 89 09/03/2023  BUN 13 09/03/2023  CREATININE 0.86 09/03/2023  EGFR >60 02/14/2013  EGFRAFRAMER 91 11/26/2020  CALCIUM 9.0 09/03/2023  ALBUMIN 4.5 09/03/2023  PROT 7.0 12/28/2018  BILITOT 0.5 09/03/2023  ALKPHOS 43 09/03/2023  ALT 14 09/03/2023  AST 15 09/03/2023  GLOB 2.3 09/03/2023  Lab Results Component Value Date  CRPQ 7.2 09/03/2023  SEDRATE 14 09/03/2023

## 2024-03-10 LAB — CBC AND DIFFERENTIAL
BASOPHILS ABSOLUTE COUNT: 39 {cells}/uL (ref 0–200)
BASOPHILS: 0.7 %
EOSINOPHILS ABSOLUTE COUNT: 50 {cells}/uL (ref 15–500)
EOSINOPHILS: 0.9 %
HEMATOCRIT BLOOD: 41 % (ref 35.0–45.0)
HEMOGLOBIN: 13.4 g/dL (ref 11.7–15.5)
LYMPHOCYTES ABSOLUTE COUNT: 1781 {cells}/uL (ref 850–3900)
LYMPHOCYTES: 31.8 %
MCH: 32.1 pg (ref 27.0–33.0)
MCHC-HEMOGLOBINOPATHY: 32.7 g/dL (ref 32.0–36.0)
MCV: 98.1 fL (ref 80.0–100.0)
MONOCYTES ABSOLUTE COUNT: 398 {cells}/uL (ref 200–950)
MONOCYTES: 7.1 %
MPV: 9.2 fL (ref 7.5–12.5)
NEUTROPHILS ABSOLUTE COUNT: 3332 {cells}/uL (ref 1500–7800)
NEUTROPHILS: 59.5 %
PLATELET COUNT: 301 Thousand/uL (ref 140–400)
RDW: 12.9 % (ref 11.0–15.0)
RED BLOOD CELL COUNT: 4.18 Million/uL (ref 3.80–5.10)
WHITE BLOOD CELL COUNT: 5.6 Thousand/uL (ref 3.8–10.8)

## 2024-03-10 LAB — COMPREHENSIVE METABOLIC PANEL
ALBUMIN/GLOBULIN RATIO: 1.8 (calc) (ref 1.0–2.5)
ALBUMIN: 4.3 g/dL (ref 3.6–5.1)
ALKALINE PHOSPHATASE: 39 U/L (ref 37–153)
ALT (SGPT): 15 U/L (ref 6–29)
AST (SGOT): 13 U/L (ref 10–35)
BILIRUBIN, TOTAL: 0.6 mg/dL (ref 0.2–1.2)
BLOOD UREA NITROGEN: 13 mg/dL (ref 7–25)
CALCIUM: 9.4 mg/dL (ref 8.6–10.4)
CHLORIDE: 104 mmol/L (ref 98–110)
CO2: 27 mmol/L (ref 20–32)
CREATININE: 0.82 mg/dL (ref 0.50–1.05)
EGFR, CREATININE (CKD-EPI 2021) QUEST: 82 mL/min/1.73m2 (ref >=60)
GLOBULIN: 2.4 g/dL (ref 1.9–3.7)
GLUCOSE: 88 mg/dL (ref 65–139)
POTASSIUM: 3.9 mmol/L (ref 3.5–5.3)
PROTEIN, TOTAL, SPEP: 6.7 g/dL (ref 6.1–8.1)
SODIUM: 141 mmol/L (ref 135–146)

## 2024-03-10 LAB — IRON, TIBC AND FERRITIN PANEL (BH GH LMW Q YH)
FERRITIN: 21 ng/mL (ref 16–232)
IRON BINDING CAPACITY: 349 ug/dL (ref 250–450)
IRON, TOTAL: 118 ug/dL (ref 45–160)
SATURATION RATIOS: 34 % (ref 16–45)

## 2024-03-10 LAB — VITAMIN D, 25-HYDROXY: VITAMIN D, 25 OH, TOTAL: 45 ng/mL (ref 30–100)

## 2024-03-10 LAB — SEDIMENTATION RATE (ESR): SED RATE BY MODIFIED WESTERGREN: 2 mm/h (ref <=30)

## 2024-03-10 LAB — C-REACTIVE PROTEIN     (CRP): C-REACTIVE PROTEIN: <3 mg/L (ref <8.0)

## 2024-03-14 ENCOUNTER — Encounter: Admit: 2024-03-14 | Payer: PRIVATE HEALTH INSURANCE | Attending: Rheumatology

## 2024-03-14 DIAGNOSIS — M797 Fibromyalgia: Principal | ICD-10-CM

## 2024-03-14 MED ORDER — GABAPENTIN 300 MG CAPSULE
300 | ORAL_CAPSULE | Freq: Every evening | ORAL | 1 refills | 30.00000 days | Status: AC
Start: 2024-03-14 — End: 2024-03-16

## 2024-03-14 MED ORDER — VENLAFAXINE IMMEDIATE RELEASE 75 MG TABLET
75 | ORAL_TABLET | ORAL | 1 refills | 30.00000 days | Status: AC
Start: 2024-03-14 — End: ?

## 2024-03-14 NOTE — Telephone Encounter
 Patient requesting 5 day refills. Patient states she had to stay in [place] for a little longer than anticipated and will need a 5 day refill.

## 2024-03-16 ENCOUNTER — Encounter: Admit: 2024-03-16 | Payer: PRIVATE HEALTH INSURANCE | Attending: Rheumatology

## 2024-03-16 DIAGNOSIS — M797 Fibromyalgia: Principal | ICD-10-CM

## 2024-03-16 MED ORDER — GABAPENTIN 300 MG CAPSULE
300 | ORAL_CAPSULE | Freq: Every evening | ORAL | 1 refills | 30.00000 days | Status: AC
Start: 2024-03-16 — End: ?

## 2024-03-16 NOTE — Telephone Encounter
 Patient states that she was informed by pharmacy that they will not be able to fill 5 day supplu but they can give her a 90 day supply.Date of last visit: 1/23/25Date of next visit:  8/5/25Last labs:Lab Results Component Value Date  WBC 5.6 03/09/2024  HGB 13.4 03/09/2024  HCT 41.0 03/09/2024  MCV 98.1 03/09/2024  MCH 32.1 03/09/2024  MCHC 32.7 03/09/2024  PLT 301 03/09/2024  MPV 9.2 03/09/2024  NEUTROPHILS 59.5 03/09/2024  LYMPHOCYTES 31.8 03/09/2024  MONOCYTES 7.1 03/09/2024  EOSINOPHILS 0.9 03/09/2024  Lab Results Component Value Date  NA 141 03/09/2024  K 3.9 03/09/2024  CL 104 03/09/2024  CO2 27 03/09/2024  GLU 88 03/09/2024  BUN 13 03/09/2024  CREATININE 0.82 03/09/2024  EGFR >60 02/14/2013  EGFRAFRAMER 91 11/26/2020  CALCIUM 9.4 03/09/2024  ALBUMIN 4.3 03/09/2024  PROT 7.0 12/28/2018  BILITOT 0.6 03/09/2024  ALKPHOS 39 03/09/2024  ALT 15 03/09/2024  AST 13 03/09/2024  GLOB 2.4 03/09/2024  Lab Results Component Value Date  SEDRATE 2 03/09/2024

## 2024-03-21 ENCOUNTER — Encounter: Admit: 2024-03-21 | Payer: PRIVATE HEALTH INSURANCE | Attending: Rheumatology

## 2024-04-11 ENCOUNTER — Encounter: Admit: 2024-04-11 | Payer: PRIVATE HEALTH INSURANCE | Attending: Rheumatology

## 2024-04-11 VITALS — BP 117/79 | HR 71 | Wt 205.0 lb

## 2024-04-11 DIAGNOSIS — M47812 Spondylosis without myelopathy or radiculopathy, cervical region: Secondary | ICD-10-CM

## 2024-04-11 DIAGNOSIS — K529 Noninfective gastroenteritis and colitis, unspecified: Secondary | ICD-10-CM

## 2024-04-11 DIAGNOSIS — D4819 Leiomyomatosis: Secondary | ICD-10-CM

## 2024-04-11 DIAGNOSIS — M2550 Pain in unspecified joint: Secondary | ICD-10-CM

## 2024-04-11 DIAGNOSIS — M5416 Radiculopathy, lumbar region: Secondary | ICD-10-CM

## 2024-04-11 DIAGNOSIS — M797 Fibromyalgia: Secondary | ICD-10-CM

## 2024-04-11 DIAGNOSIS — Z1589 Genetic susceptibility to other disease: Principal | ICD-10-CM

## 2024-04-12 NOTE — Progress Notes
 [facility], [place], [address]Eynwz: [phone]  Fax: [phone] Dyett Desir, MDSonia, Sandie, MDKristin Atilano PA-CFace to faceHistory of present Laurie White is a 63 y.o. female with fibromyalgia and chronic joint pain in the setting of a normal ESR, CRP, and negative serologies and bone scan, she presents for a follow-up visit.  Last seen 09/30/2023 History Laurie White presented on 10/31/2012 as a 63 year old with a strong family history of inflammatory arthritis in her mother and sister [name]), who was well until her early 57s when she developed initial symptoms of numbness in her hands and pain in her fingers. She came for a rheumatology consultation for joint pain. 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 She notes she saw a specialist several years ago, whom she believes was a neurologist. Nerve conduction studies indicated carpal tunnel syndrome of unknown severity, and she was advised to wear wrist splints.She experienced stiffness in her fingers, with soreness in her hands and knuckles. She also noted pain in her DIP joints. She saw rheumatologist Nathanel Endow, MD, who prescribed Mobic and followed her care for 3 years. More recently, she has been seeing Christena Legions, MD, for the past 2 years but notes she will not return to that office.She has experienced stiffness in her neck, pain in her ankles, feet, fingers, wrists, and hips. The worst stiffness lasts for 10 minutes in the mornings, but she has stiffness throughout the day.  Her last appointment with Dr. Legions was on 09/13/2012, where she was prescribed Prednisone 10 mg PO QAM. She noted a 20 lbs weight gain over the past year.While on Prednisone, she experienced marked improvement with less joint pain. However, she expressed concern about hearing a cracking sound in her neck and still has persistent stiffness in her ankles and hips, which remain sore and stiff.She mentioned, I am not a good pill taker.In addition, she was diagnosed with plantar fasciitis and possible Morton?s neuroma by Arletta Pal II, DPM, who gave her a left heel injection in 2012. She has improved since the injection and with the use of orthotics. She was also diagnosed with fibromyalgia.She had a bone scan and lab tests. She was eventually taken off Prednisone and diagnosed with non-inflammatory polyarthralgia. Labs, X-rays, and a bone scan showed very little to suggest inflammatory arthritis. 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 Negative serologies, including RF and CCP, were noted.She was advised that she could still develop spondyloarthropathy as she is HLA-B27 positive. A bone scan was negative for inflammatory arthritis.For treatment, she failed Cymbalta and Neurontin , but was transitioned to Effexor  with good results.Immunization History Administered Date(s) Administered  COVID-19 Vaccine - PFIZER 12/26/2019, 01/16/2020  Interim history03/02/2024: She had a normal BMD. 11/15/2023: She had a stable mammogram without suspicious features identified. 03/2024: She had a colonoscopy and had a polyp and she was found to have colitis. She was treated with Budesonide for 8 weeks with Dr Arminda and stopped 04/03/2024. She has no follow up scheduled.Social history Married to Maringouin who is retired.  They are living in  [facility] , [place] . She has been there since April 2022.  Daughter Laurie White, 27  (2/9), works in the [employer] system as a Pediatric MSW. Laurie White is  getting married to her girlfriend in October 2026.  Son Laurie White, 29 (7/23), is in Counselling psychologist at [employer] in [place]  and has been married since 10/31/2022. Her son and  daughter-in-law, Laurie White has her master's in Art History and works for [employer]), live 14 hours away.  Baby is due May 16 2024 Takenya will spend a month in [place] .She notes her son and daughter-in-law plan to relocate to [place] .Visited her sister Laurie White yesterday. Laurie White fell July 25 in her bathroom and has 2 black eyesTravel historyTexas in [place]  - Flew in on 04/09/2024 to [facility]  until 09/29/2023. She is here for a week.She is [place] every 4-6 weeks to [place] to see her sister.Laurie White lives in [place]  Medication historyShe has failed a number of medications, including Savella, Ultram, Neurontin , Cymbalta, Etodolac, Lodine, Mobic, and could not afford Celebrex.Healthcare team PCP: Ricka Forest, APRN in the office of Lynwood Searle, MD, [place], [place]  Urology: Ozell Branch, MD  Ortho ([place]): Bernardino Dinsmore, MD  Podiatry: Arletta Pal II, DPMFamily history She has a strong family history of inflammatory arthritis in her mother and sister [name]).  She lost her mother in June 2016 from renal cancer.  Her children are negative for the gene.Current Outpatient Medications Medication Sig  calcium carbonate/vitamin D3 (VITAMIN D -3 ORAL) Take by mouth. Take 2000 mg iu  ferrous sulfate (IRON  ORAL) Take by mouth.  gabapentin  Take 3 capsules (900 mg total) by mouth nightly. Take 3 hours before bed.  levothyroxine  1 tablet (50 mcg total) daily.  tiZANidine  TAKE 2 TABLETS (4 MG TOTAL) BY MOUTH NIGHTLY AS NEEDED. FOR BACK PAIN  venlafaxine  Take 2 tablets (150 mg total) by mouth daily.  gabapentin  Take 3 capsules (900 mg total) by mouth nightly. Take 3 hours before bed. No current facility-administered medications for this visit. No Known Allergies Review of Systems (negative except as noted; positive findings bold)Constitutional: fatigue, malaise, fever, chills, sweats, weakness, feeling sickly, swollen glands, weight loss, weight gain, weight changes.Current weight 216 lbs up from 205 lbs. Peak 220 lbs Eye : Eye pain, eye redness, dry eye, loss of vision, double vision, blurry vision ENT : Ringing in the ears, hearing loss, nose bleeds, dry nose, dry mouth, mouth sores, loss of taste, hoarseness, problems with smell, lump in throat, stuffy nose, post-nasal drip Cardiac : Chest pain, irregular heart beat, palpitations, murmur, fainting spells (syncope), swollen legs or feetResp: Shortness of breath, difficulty breathing at night, cough, wheezingGI : Nausea, vomiting, vomiting of blood or coffee grounds, abdominal pain, constipation, diarrhea, blood in stools, black stools, heart burn, loss of appetite, trouble swallowingGenitourinary: Difficult urination, pain or burning on urination, blood in urine, cloudy/smoky urine,  Musculoskeletal: Morning stiffnessJoint pain, Joint swelling, muscle weakness, muscle tenderness, muscle spasm, neck pain, lower back painSkin: Easy bruising, rash, psoriasis, hives, skin tightness, hair lossRaynaud's: Color changes of hands and feet in the coldNeurology: Headaches, dizziness, problems with memory, loss of balance, numbness, tingling, problems with thinkingPsychiatric: anxiety, depression, problems with social activitiesSleep: Difficulty falling asleep, difficulty staying asleepEndocrine: Excessive thirst BP 117/79 (Site: l a, Position: Sitting, Cuff Size: Large)  - Pulse 71  - Wt 216 lb  - SpO2 98%  - BMI 32.84 kg/m?  Physical ExamGENERAL: alert and oriented X 3, HEENT:   no alopecia,  conjunctiva moist, Large varicosities in her tongue, and on her lips ( Purple discoloration-congenital) NECK: Hemangioma , right side of neck , No  axillary adenopathy  LUNGS: clear  COR: regular rate and rhythm, SKIN:  as aboveVASCULAR: temporal, radial, dp and posterior tibial arteries pulsatile. No clubbing or edema is present.  Large varicosities ( head and neck)    NEUROLOGIC: normoactive symmetric reflexes, no sensory deficits,  Positive L>>>R Tinel's test. MUSCULOSKELETAL:Muscle tone and strength are normal.   No kyphosis is noted.   She had mild decrease in ROM of her  neck &  No trap spasm +decrease in ROM of  lower back.     Para lumbar tenderness  On examination of her joints she had OA changes of her  DIPs, Resolved trigger finger 3rd finger left hand Normal PIPs & Normal MCPs   She had full ROM of her wrists, elbows & shoulders.   + Ulnar tinel'sShe had a mild decrease in  ROM of her hips. Non tender  right trochanteric bursa   Knees had FROM and there was no evidence of an effusion; she had no instability; no  varus or valgus deformities.       he had no acute synovitis of any joints.       She had paired positive paired tender points. Labs reviewed with patient Lab Results Component Value Date  WBC 5.6 03/09/2024  HGB 13.4 03/09/2024  HCT 41.0 03/09/2024  MCV 98.1 03/09/2024  MCH 32.1 03/09/2024  MCHC 32.7 03/09/2024  PLT 301 03/09/2024  MPV 9.2 03/09/2024  NEUTROPHILS 59.5 03/09/2024  LYMPHOCYTES 31.8 03/09/2024  MONOCYTES 7.1 03/09/2024  EOSINOPHILS 0.9 03/09/2024  Lab Results Component Value Date  NA 141 03/09/2024  K 3.9 03/09/2024  CL 104 03/09/2024  CO2 27 03/09/2024  GLU 88 03/09/2024  BUN 13 03/09/2024  CREATININE 0.82 03/09/2024  EGFR >60 02/14/2013  EGFRAFRAMER 91 11/26/2020  CALCIUM 9.4 03/09/2024  ALBUMIN 4.3 03/09/2024  PROT 7.0 12/28/2018  BILITOT 0.6 03/09/2024  ALKPHOS 39 03/09/2024  ALT 15 03/09/2024  AST 13 03/09/2024  GLOB 2.4 03/09/2024  Lab Results Component Value Date  SEDRATE 2 03/09/2024  SEDRATE 14 09/03/2023  SEDRATE 9 03/31/2023 Lab Results Component Value Date  CRPQ <3.0 03/09/2024  Mammogram on 11/15/2023: Findings: There are scattered areas of fibroglandular density. Stable benign type calcifications are present but no suspicious clusters are seen. No developing distortions or discrete nodules are apparent as well.Impression: Stable examination without suspicious features identified BREAST:  Right RECOMMENDATION:  Annual screening mammogram in 1 yearBREAST:  Left RECOMMENDATION:  Annual screening mammogram in 1 year Dexa Bone Density Spine And Hip (YBC YH LM WH) on 11/10/2023: Bone Density: Exam date 11/11/2023 Region BMD(g/cm2) T-score Z-score Classification AP Spine(L1-L4) 1.267  2.0  3.5 Normal Femoral Neck(Left) 0.870  0.2  1.5 Normal Total Hip(Left) 1.082  1.1  2.1 Normal World Health Organization criteria for BMD impression classify patients as Normal (T-score at or above -1.0), Osteopenia (T-score between -1.0 and -2.5), or Osteoporosis (T-score at or below -2.5).      10-year Fracture Risk?: Major Osteoporotic Fracture 5.9% Hip Fracture 0.1% Reported Risk Factors: [place]  (Caucasian), Neck BMD=0.870, BMI=30.4 ? FRAX? Version 3.08. Fracture probability calculated for an untreated patient. Fracture probability may be lower if the patient has received treatment.  [place] Abdomen Pelvis w IV Contrast on 06/09/2023: FINDINGS:Lung bases: Visualized lung bases are clear. Hepatobiliary: Stable punctate hepatic hypodensity too small to characterize. Mild focal fatty infiltration along the falciform ligament. Cholelithiasis. Pancreas: Unremarkable. Spleen: Unremarkable. Adrenal glands: Unremarkable. Kidneys: Homogenous enhancing bilateral kidneys without discrete lesion. No hydronephrosis. Bowel: No bowel obstruction. Colonic diverticulosis without evidence of acute diverticulitis. Interval somewhat thickened appearance of the appendix measuring 0.8 cm. No periappendiceal fat stranding or fluid collection. No intraluminal air density or fecalith. Mild lipomatosis of the ileocecal valve. Abdominal and pelvic lymph nodes: No lymphadenopathy. Peritoneum: No ascites. Vasculature: No abdominal aortic  aneurysm.  Pelvis: Total abdominal hysterectomy. Stable 1.1 cm soft tissue density within the right inguinal canal.. Both ovaries not discretely visualized from the adjacent bowel loops Musculoskeletal system and soft tissue: No aggressive osseous lesion. Multilevel degenerative changes of the visualized spine, similar to prior exam. IMPRESSION:1.Homogeneously enhancing kidneys; no renal mass.2.Nonspecific thickened appearance of the appendix; no surrounding inflammatory changes. Correlate clinically to exclude symptoms of possible appendicitis and correlation with screening colonoscopic findings for the appendicular orifice. Latest Reference Range & Units 09/05/21 11:50 12/10/21 10:34 03/06/22 11:45 05/07/22 11:28 08/27/22 10:02 CRP <8.0 mg/L 7.1 7.7 6.6 6.3 8.3 (H)  Latest Reference Range & Units 12/10/21 10:34 03/06/22 11:45 05/07/22 11:28 08/27/22 10:02 Sed Rate By Modified Westergren < OR = 30 mm/h 2 2 6 9   Latest Reference Range & Units 09/26/20 10:59 09/05/21 11:50 12/10/21 10:34 05/07/22 11:28 Cholesterol, Total <200 mg/dL    778 (H) HDL Cholesterol > OR = 50 mg/dL    77 LDL Cholesterol mg/dL (calc)    882 (H) Non-HDL Cholesterol <130 mg/dL (calc)    855 (H) Chol/HDL Ratio <5.0 (calc)    2.9 Iron , Total 45 - 160 mcg/dL 61   78 Iron  Binding Capacity 250 - 450 mcg/dL (calc) 597   620 Ferritin 16 - 232 ng/mL 14 (L)   23 Creatine Kinase, Total 29 - 143 U/L    71 Vitamin D , 25 OH, Total 30 - 100 ng/mL  39 32 45 XR LUMBAR SPINE AP AND LATERAL 10/4/22Impression: Multilevel degenerative changes of the lower lumbar spine.XR HIPS BILATERAL AP LATERAL W AP PELVIS 10/4/22Impression: Mild degenerative changes involving bilateral hip joints.[place] ABDOMEN PELVIS W WO IV CONTRAST on 03/25/2021 12:06 PMImpression: No renal mass.03/06/19 XR Hips b/l AP lateral Normal Hips 03/06/2019   XR Lumbar Spine AP/ Lateral DDDThere is mild L3-4, moderate L4-5, and moderately severe L5-S1 degenerative disc disease with facet arthropathy in the lower lumbar spine.03/06/2019 [place] ABD/Pelvis W/WO IV ContrastClinical indications: History of hereditary leiomyomatosis and renal cell cancer syndromeImpresson: No evidence of renal mass, small left inguinal hernia Bone Density: Exam date 02/11/2020Region BMD(g/cm2) T-score Z-score Classification AP Spine (L1-L4) 1.233  1.7  2.8 Normal Femoral Neck (Left) 0.943  0.8  1.9 Normal Total Hip (Left) 1.109  1.4  2.1 Normal  10-year Fracture Risk?: Major Osteoporotic Fracture 4.6% Hip Fracture < 0.1% 07/15/2016   BMD Right femur t score -0.7 L1-4  t score 1.7Left femur  -0.1Old data 06/29/2016 Lyme IgG WB  positive  11/26/2015  Urine Cx Calcium Oxalate BMD was normal in 04/13/2013 12/16/2012  Bone scan Mild uptake Right AC joint2/25/2014  ANA negRF neg at 6.20, CCP neg SPEP negParvovirus IgM , IgG negHep screen negACE 44 ( normal) HLA  B 27 +  09/29/2023   1:56 PM 04/02/2023  10:08 AM 09/26/2022  10:26 AM 05/03/2022   9:27 AM 09/22/2021  11:24 PM 06/10/2021  11:34 AM 09/10/2020   3:07 PM Rapid 3 Assessment Function 1  1 1 2 4 4 1  Pain 4 4.5 6 4 5  4.5  3.5 Patient Global 3 4 4 3  4.5 5  5  Rapid3 (of 30) 7.3  8.8 10.3 7.7 10.8  10.8  8.8    Patient-reported  Proxy-reported  Data saved with a previous flowsheet row definition   04/02/2023  10:11 AM 09/26/2022  10:28 AM 05/03/2022   9:29 AM 09/22/2021  11:26 PM 06/10/2021  11:36 AM 09/10/2020   3:05 PM 03/26/2020   7:57 AM Global Health Scale Score Global Physical Health  Raw Score: 14 14 13 13 13 16 15  Global Physical Health T-Score: 37.4 37.4 34.9 34.9 34.9 39.8 37.4 Global Mental Health Raw Score:  16 17 16 15 16 19 17  Global Mental Health T-Score: 53.3 56 53.3 50.8 53.3 62.5 56 Calculated EQ-5D: 0.76 0.77 0.69 0.67 0.69 0.81 0.79  Assessment/PlanCOVID STATUS She declines further COVID vaccines and further discussion. 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  She was treated with Budesonide for 8 weeks with Dr Arminda and stopped 04/03/2024 She has no follow up scheduled.Her BMs are back to normal. She was advised to have a follow up visit with Dr Arminda.FIBROMYALGIA She is currently on Effexor  150 mg 2 po QD and Neurontin  900 mg 3 hours before bed, and both are prescribed by this office. She is feeling well. She is tolerating this regimen well.Her Neurontin  was refilled on 03/16/2024 for 90 days.  She has restorative sleep.  She will continue Effexor  150 mg, 2 PO QD, and Neurontin  900 mg, 3 hours before bed, as prescribed by this office.  Labs showed no toxicityPAIN MANAGEMENTNeurontin Failed Cymbalta, Ultram as above.   NECK PAINShe has neck pain and trap spasm She uses heat and did not get the  lidocaine patch  & a roll on AspercremeShe rates her neck pain 7/10 in the car while knitting and with driving. She did not like how she felt on tizanidineShe did not feel that she needs a muscle relaxant. RIGHT SHOULDER PAIN She was seen by Jerel Rio, MD, Orthopedics Surgery, for right shoulder pain and Elbow pain after a fall on a slippery rock on 07/09/2023. She completed Physical therapy and is pain free.Dr Jerel Rio retired.LOWER BACK PAIN  She saw Dr Valri on 11/12/2021 and was told she had age related wear and tear and did not recommend surgery. Previous XRs showed moderately severe DDD at L 2-S 2, with severe changes at L5-S1.  She did not like how she felt on tizanidine . She has not  facet or epidural injectionsShe rates her level of low back pain 4.5/10 She is currently on Neurontin  900  mg 3 hours before bed from this office at 7 PM every night.She is tolerating it well. 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 POSSIBLE RLS She notes her symptoms of restless legs increase with dehydration.She continues gabapentin  QPM. HLA B 27 + Nothing to suggest ankylosing spondylitis. She has no synovitis.A bone scan was negative in 2014. KNEE PAINShe has intermittent soreness with stairs.She was not injured in her fall. HEREDITARY LEIOMYOMATOSIS She was seen by Laymon Southgate, PA, Urology for follow up of hereditary leiomyomatosis and family history of renal cell cancer on 06/16/2023. Her [place] abdomen was reviewed with the patient. The patient was doing well without any complaints. The patient had no gross hematuria, or pain. She denied any fevers, chills, nausea, emesis, or diarrhea. She was advised on continuing surveillance consisting of kidney imaging. She wished to avoid MRI due to claustrophobia. Her [place] was ordered to be taken in a year and follow up after that. They discussed that the incidental finding of  appendiceal thickening will  be send to her PCP for further work up. She has a follow up [place] scan of her abdomen on 06/15/2024 and she then has a video appointment to see Laymon Southgate, PA, Urology on 06/16/2024. THICKENING OF APPENDIX Her [place] on 06/09/2023, showed homogeneously enhancing kidneys; no renal mass. Nonspecific thickened appearance of the appendix; no surrounding inflammatory changes. Correlate clinically to exclude symptoms of possible appendicitis and correlation with screening colonoscopic findings for the appendicular orifice.She was discussed this with her gastroenterologist. She has a follow up [place] scan on 06/15/2024 for her Cancer screenings for the kidney and it will show the Appearance of the Appendix. FALL She fell in October 2024 and injured her right shoulder and hipShe has recovered. She slipped off a wet rock. Fall precautions reviewed. HEALTH MAINTENANCE Colonoscopy Jan 29 [facility] Gastro[facility]ologyC[facility] on 09/30/2023 Ricka Forest APRN. Mammogram on 11/15/2023. WEIGHT Her current weight 205 lbs up from  remains  unchanged since the last visit. She was advised to work on weight loss and resume a lower-calorie diet and increase their physical activity level.BONE PAIN Her BMD was normal on 11/11/2023. Her vitamin D  was 45 on 03/09/2024. Gordon-Dole MDOn the day of this patient's encounter, a total of 58 minutes was personally spent by me.  This does not include any resident/fellow teaching time, or any time spent performing a procedural service. Scribed for Sandie Bartholome HERO, MD by Perri Flight, medical scribe April 11, 2024 The documentation recorded by the scribe accurately reflects the services I personally performed and the decisions made by me. I reviewed and confirmed all material entered and/or pre-charted by the scribe. Bartholome HERO Sandie, MDElectronically Signed by Bartholome HERO Sandie, MD, April 11, 2024

## 2024-06-13 ENCOUNTER — Encounter: Admit: 2024-06-13 | Payer: PRIVATE HEALTH INSURANCE | Attending: Rheumatology

## 2024-06-13 MED ORDER — VENLAFAXINE IMMEDIATE RELEASE 75 MG TABLET
75 | ORAL_TABLET | ORAL | 1 refills | 90.00000 days | Status: AC
Start: 2024-06-13 — End: ?

## 2024-06-13 NOTE — Telephone Encounter
 Last visit: 8/05/2025Next visit: 10/02/2024 & 8/12/2026Last labs:Lab Results Component Value Date  WBC 5.6 03/09/2024  HGB 13.4 03/09/2024  HCT 41.0 03/09/2024  MCV 98.1 03/09/2024  MCH 32.1 03/09/2024  MCHC 32.7 03/09/2024  PLT 301 03/09/2024  MPV 9.2 03/09/2024  NEUTROPHILS 59.5 03/09/2024  LYMPHOCYTES 31.8 03/09/2024  MONOCYTES 7.1 03/09/2024  EOSINOPHILS 0.9 03/09/2024  Lab Results Component Value Date  NA 141 03/09/2024  K 3.9 03/09/2024  CL 104 03/09/2024  CO2 27 03/09/2024  GLU 88 03/09/2024  BUN 13 03/09/2024  CREATININE 0.82 03/09/2024  EGFR >60 02/14/2013  EGFRAFRAMER 91 11/26/2020  CALCIUM 9.4 03/09/2024  ALBUMIN 4.3 03/09/2024  PROT 7.0 12/28/2018  BILITOT 0.6 03/09/2024  ALKPHOS 39 03/09/2024  ALT 15 03/09/2024  AST 13 03/09/2024  GLOB 2.4 03/09/2024  Lab Results Component Value Date  CRPQ <3.0 03/09/2024  SEDRATE 2 03/09/2024

## 2024-06-15 ENCOUNTER — Encounter: Admit: 2024-06-15 | Payer: PRIVATE HEALTH INSURANCE | Attending: Rheumatology

## 2024-06-15 ENCOUNTER — Inpatient Hospital Stay: Admit: 2024-06-15 | Discharge: 2024-06-15 | Payer: PRIVATE HEALTH INSURANCE

## 2024-06-15 DIAGNOSIS — M797 Fibromyalgia: Principal | ICD-10-CM

## 2024-06-15 DIAGNOSIS — Z1509 Genetic susceptibility to other malignant neoplasm: Secondary | ICD-10-CM

## 2024-06-15 MED ORDER — SODIUM CHLORIDE 0.9 % LARGE VOLUME SYRINGE FOR AUTOINJECTOR
Freq: Once | INTRAVENOUS | Status: DC | PRN
Start: 2024-06-15 — End: 2024-06-20

## 2024-06-15 MED ORDER — IOHEXOL 350 MG IODINE/ML INTRAVENOUS SOLUTION
350 | Freq: Once | INTRAVENOUS | Status: CP | PRN
Start: 2024-06-15 — End: ?
  Administered 2024-06-15: 11:00:00 350 mL via INTRAVENOUS

## 2024-06-15 MED ORDER — GABAPENTIN 300 MG CAPSULE
300 | ORAL_CAPSULE | Freq: Every evening | ORAL | 1 refills | 30.00000 days | Status: AC
Start: 2024-06-15 — End: ?

## 2024-06-15 NOTE — Telephone Encounter
 Date of last visit: 8/5/25Date of next visit: 1/26/26Last labs:Lab Results Component Value Date  WBC 5.6 03/09/2024  HGB 13.4 03/09/2024  HCT 41.0 03/09/2024  MCV 98.1 03/09/2024  MCH 32.1 03/09/2024  MCHC 32.7 03/09/2024  PLT 301 03/09/2024  MPV 9.2 03/09/2024  NEUTROPHILS 59.5 03/09/2024  LYMPHOCYTES 31.8 03/09/2024  MONOCYTES 7.1 03/09/2024  EOSINOPHILS 0.9 03/09/2024  Lab Results Component Value Date  NA 141 03/09/2024  K 3.9 03/09/2024  CL 104 03/09/2024  CO2 27 03/09/2024  GLU 88 03/09/2024  BUN 13 03/09/2024  CREATININE 0.82 03/09/2024  EGFR >60 02/14/2013  EGFRAFRAMER 91 11/26/2020  CALCIUM 9.4 03/09/2024  ALBUMIN 4.3 03/09/2024  PROT 7.0 12/28/2018  BILITOT 0.6 03/09/2024  ALKPHOS 39 03/09/2024  ALT 15 03/09/2024  AST 13 03/09/2024  GLOB 2.4 03/09/2024  Lab Results Component Value Date  SEDRATE 2 03/09/2024

## 2024-06-16 ENCOUNTER — Ambulatory Visit: Admit: 2024-06-16 | Payer: PRIVATE HEALTH INSURANCE | Attending: Surgical

## 2024-06-20 ENCOUNTER — Ambulatory Visit: Admit: 2024-06-20 | Payer: PRIVATE HEALTH INSURANCE | Attending: Surgical

## 2024-06-20 VITALS — Ht 68.0 in | Wt 200.0 lb

## 2024-06-20 DIAGNOSIS — K8020 Calculus of gallbladder without cholecystitis without obstruction: Secondary | ICD-10-CM

## 2024-06-20 DIAGNOSIS — Z1509 Genetic susceptibility to other malignant neoplasm: Secondary | ICD-10-CM

## 2024-06-20 NOTE — Progress Notes
 Follow Up NoteChief Complaint:  The patient is a 63 y.o. female with HLRCCHistory of Present Illness:  Laurie White is a 63 y.o. 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 Her mother had metastatic HLRCC and passed away from [facility] in 2016-07-14. She is feeling well without issuesFifty throughout female with HLRCC with history of fibroids and cutaneous leiomyomas.  Her mother passed away from metastatic RC.  Laurie White children were tested negative for a germ line mutation however her sister's son who was in college tested positive for a germ line mutation in FH. She is doing well without any issues.  She did see a dermatologist did have 1 of her skin lesions treated in the past year. She has been seen by Dr. Laurita since 2013-07-14 and subsequently Dr. Vallarie. The patient presents with no issues. [place] abdomen and pelvis with and without contrast on 03/06/2019 is negative for a renal mass, stone, or hydronephrosis.Had renal US  on 03/18/2020 which showed no evidence of renal mass. Renal US  on 03/02/2022 showed no renal mass. The patient is doing well and has no complaints. The patient has no gross hematuria, dysuria, or pain, and denies fevers, chills, nausea, emesis, or diarrhea.Recent [place] on 06/15/24 shows no masses, incidental gallstones noted. No urinary symptoms, no hematuria. Is planning on transitioning care to [place] . 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 Oral contrast was not administered. There was an injector malfunction such that initial imaging is without intravenous contrast. This was subsequently repeated with intravenous contrast. FINDINGS: LOWER CHEST: Limited views through the lower chest are unremarkable. LIVER: Negative BILIARY TREE: Negative GALLBLADDER: Redemonstrated cholelithiasis. SPLEEN: Negative PANCREAS: Negative ADRENAL GLANDS: Negative KIDNEYS: No focal mass. VASCULAR: Negative BOWEL AND MESENTERY: Negative; normal cecal appendix. LYMPH NODES: No adenopathy. PERITONEUM: No ascites. ABDOMINAL WALL: No hernia or mass. PELVIS: Status post hysterectomy. No pelvic soft tissue mass or focal fluid collection. MUSCULOSKELETAL: No suspicious osseous lesions. Impression: No evidence of a renal mass or other focal abnormality in the abdomen or pelvis. Reported and signed by: Elsie Li, MD  [facility] Radiology and Biomedical Imaging [place] ABDOMEN PELVIS W IV CONTRAST on 06/09/2023 12:20 PM INDICATION: Hereditary Leiomyomatosis and renal cell carcinoma. COMPARISON: [place] abdomen and pelvis 03/25/2021 TECHNIQUE: [place] images of the abdomen and pelvis were obtained after the administration of intravenous contrast. IV contrast administered:  80 ML IOHEXOL  (OMNIPAQUE ) 350 MG IODINE /ML INJECTION Technical limitations: None. FINDINGS: Lung bases: Visualized lung bases are clear. Hepatobiliary: Stable punctate hepatic hypodensity too small to characterize. Mild focal fatty infiltration along the falciform ligament. Cholelithiasis. Pancreas: Unremarkable. Spleen: Unremarkable. Adrenal glands: Unremarkable. Kidneys: Homogenous enhancing bilateral kidneys without discrete lesion. No hydronephrosis. Bowel: No bowel obstruction. Colonic diverticulosis without evidence of acute diverticulitis. Interval somewhat thickened appearance of the appendix measuring 0.8 cm. No periappendiceal fat stranding or fluid collection. No intraluminal air density or fecalith. Mild lipomatosis of the ileocecal valve. Abdominal and pelvic lymph nodes: No lymphadenopathy. Peritoneum: No ascites. Vasculature: No abdominal aortic aneurysm.  Pelvis: Total abdominal hysterectomy. Stable 1.1 cm soft tissue density within the right inguinal canal.. Both ovaries not discretely visualized from the adjacent bowel loops Musculoskeletal system and soft tissue: No aggressive osseous lesion. Multilevel degenerative changes of the visualized spine, similar to prior exam. IMPRESSION:1.         Homogeneously enhancing kidneys; no renal  mass.2.         Nonspecific thickened appearance of the appendix; no surrounding inflammatory changes. Correlate clinically to exclude symptoms of possible appendicitis and correlation with screening colonoscopic findings for the appendicular orifice.Medical History:Past Medical History: Diagnosis Date  Birth mark    lips left breast , right side of neck  Lyme disease 2009  Lyme in child hood and in 2009  Lymphadenitis 06/2010  Right side of neck , 06/2010 - Keflex and Septra  Neuroma   foot?  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/27/2018  Spondylosis of cervical region without myelopathy or radiculopathy 09/27/2018  Cervicalgia 09/27/2018  Low back pain, unspecified back pain laterality, unspecified chronicity, unspecified whether sciatica present 09/27/2018  Long term (current) use of non-steroidal anti-inflammatories (nsaid) 09/27/2018  Lesion of ulnar nerve, right upper limb 09/27/2018  Hyperlipidemia, unspecified hyperlipidemia type 09/27/2018  Carpal tunnel syndrome, unspecified laterality 09/27/2018  Fibromyalgia 09/27/2018  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) 02/14/2013 Surgical History:Past Surgical History: Procedure Laterality Date  BUNIONECTOMY Left 1989  CARPAL TUNNEL RELEASE Left 09/20/2019  CARPAL TUNNEL RELEASE Right 11/08/2019  CESAREAN SECTION  96, 98  FOOT SURGERY    numerous  MYOMECTOMY  12/1992  TOE SURGERY Bilateral   Toe surgeries in 1980s both feet  TONSILLECTOMY  1972  age 78  TOTAL ABDOMINAL HYSTERECTOMY  2000  one ovary remains 2000 due to Fibroids  TRIGGER FINGER RELEASE Left 09/20/2019  at time of [facility], left middle finger release  TRIGGER FINGER RELEASE Right 11/08/2019  Rigt index , and middle finger  UTERINE FIBROID SURGERY  1984  Fibroid  uterus 1984 removed large Fibroid Family History:Family History Problem Relation Age of Onset  Kidney cancer Mother   Spondyloarthropathy Mother   Irritable bowel syndrome Mother       Colitis  Rheumatoid arthritis Sister   Rheumatoid arthritis Paternal Aunt   Osteoarthritis Paternal Aunt   Osteoporosis Paternal Aunt  History reviewed. No pertinent family history of urologic malignancy.Social History:Social History Socioeconomic History  Marital status: Married Tobacco Use  Smoking status: Never  Smokeless tobacco: Never Substance and Sexual Activity  Alcohol use: Yes   Alcohol/week: 4.0 standard drinks of alcohol   Types: 4 Glasses of wine per week  Drug use: No Social Drivers of Conservation officer, historic buildings Strain: Low Risk  (09/30/2023)  Received from [facility]  Financial Resource Strain   Difficulty paying for basics: Not hard at all   How hard is it for you to pay for utilities (electricity, gas, water)?: Not hard at all Food Insecurity: No Food Insecurity (09/30/2023)  Received from [facility] and [facility]  Food Insecurity   Worry that food will run out: Never true   Inability to get food: Never true Transportation Needs: No Transportation Needs (09/30/2023)  Received from [facility]  Transportation Needs   Lacking transport to medical appts: No   Lacking transport to non-medical: No Physical Activity: Sufficiently Active (09/30/2023)  Received from [facility]  Exercise Vital Sign   Days of Exercise per Week: 5 days   Minutes of  Exercise per Session: 30 min Stress: No Stress Concern Present (09/30/2023)  Received from [facility] and [facility]  [employer] of Occupational Health - Occupational Stress Questionnaire   Feeling of Stress : Not at all Social Connections: Moderately Integrated (09/30/2023)  Received [facility] and [facility]  Social Connections   Phone Communication: More than three times a week   Get together with friends / family: More than three times a week   Attend religious services: More than 4 times per year   Club Membership: No   Club Attendance: Never   Marital Status: Married Housing Stability: Low Risk  (09/30/2023)  Received from [facility]  Housing Stability   Unable to Pay for Housing in the Last Year: No   Number of Places Lived in the Last Year: 2   Unstable Housing in the Last Year: No  Medications:Current Outpatient Medications on File Prior to Visit Medication Sig Dispense Refill  calcium carbonate/vitamin D3 (VITAMIN D -3 ORAL) Take by mouth. Take 2000 mg iu    ferrous sulfate (IRON  ORAL) Take by mouth.    gabapentin  (NEURONTIN ) 300 mg capsule TAKE 3 CAPSULES (900 MG TOTAL) BY MOUTH NIGHTLY. TAKE 3 HOURS BEFORE BED. 270 capsule 0  levothyroxine  (SYNTHROID , LEVOTHROID) 50 MCG tablet 1 tablet (50 mcg total) daily.  0  tiZANidine  (ZANAFLEX ) 2 mg tablet TAKE 2 TABLETS (4 MG TOTAL) BY MOUTH NIGHTLY AS NEEDED. FOR BACK PAIN 60 tablet 2  venlafaxine  (EFFEXOR ) 75 mg Immediate Release tablet TAKE 2 TABLETS BY MOUTH DAILY. 180 tablet 0 Current Facility-Administered Medications on File Prior to Visit Medication Dose Route Frequency Provider Last Rate Last Admin  [DISCONTINUED] sodium chloride  0.9% large volume syringe for autoinjector 40 mL  40 mL Intravenous ONCE PRN Rylei Masella, PA     Allergies:No Known AllergiesReview of Systems: (ROS was reviewed since prior visit)As per HPIPhysical Exam:Ht 5' 8 (1.727 m)  - Wt 90.7 kg  - BMI 30.41 kg/m? ECOG: 0PAIN ASSESSMENT: 0General Appearance: Alert, cooperative, no distress, well developed,			well nourished, appears stated ageHead: Normocephalic, without obvious abnormality, atraumaticEyes: Conjunctiva and lids within normal limitsEars, Nose, Mouth: External appearance within normal limits, hearing grossly intact Neck: Supple, normal range of motionRespiratory: Non laboredPsychiatric: Alert/oriented x 3, affect within normal limits, mood within normal limitsNeurological: Gait stable for age, no focal defects. motor exam grossly intactImaging Studies: [place] ABDOMEN PELVIS W WO IV CONTRAST on 03/25/2021 12:06 PM INDICATION: History of HLRCC. COMPARISON: [place]trasound 03/18/2020, [place] 03/06/2019 TECHNIQUE: [place] images of the abdomen and pelvis were obtained before and after the administration of 100 cc intravenous contrast (Omnipaque  350). FINDINGS:Lung bases: Visualized lung bases are clear. Hepatobiliary: Cholelithiasis. Pancreas: Unremarkable. Spleen: Unremarkable. Adrenal glands: Unremarkable. Kidneys: No renal mass.   Bowel: No bowel obstruction. Small hiatal hernia. Abdominal and pelvic lymph nodes: No lymphadenopathy. Peritoneum: No ascites. Pelvis: No mass. Hysterectomy. Unchanged rounded structure in the right inguinal canal. Musculoskeletal system and soft tissue: No aggressive osseous lesion. IMPRESSION:No renal mass. Reported And Signed By: Chrystal Currier, MD  [facility] Radiology and Biomedical ImagingLaboratory: N/APathology: N/AAssessment and Plan: The patient is a 31F  with a history of HLRCC undergoing ongoing kidney surveillance. We reviewed her diagnosis and management history to date. Will continue surveillance consisting of kidney imaging. We have been alternating between renal US  and  as she wishes to avoid MRI due to claustrophobia. Planning to transition care to [place] , recommend US  renal in 1 year. Discussed incidental gallstones, patient's GI provider already  awareMeaghan Emeline, PA-C VIDEO TELEHEALTH VISIT: This clinician is part of the telehealth program and is conducting this visit in a currently approved location. For this visit the clinician and patient were present via interactive audio & video telecommunications system that permits real-time communications, via the [employer].Patient's use of the telehealth platform followed consent and acknowledges agreement to permit telehealth for this visit. State patient is located in: [place] clinician is appropriately licensed in the above state to provide care for this visit. Other individuals present during the telehealth encounter and their role/relation: noneBecause this visit was completed over video, a hands-on physical exam was not performed. Patient/parent or guardian understands and knows to call back if condition changes.

## 2024-09-04 ENCOUNTER — Encounter: Admit: 2024-09-04 | Payer: PRIVATE HEALTH INSURANCE | Attending: Rheumatology

## 2024-09-04 DIAGNOSIS — E559 Vitamin D deficiency, unspecified: Secondary | ICD-10-CM

## 2024-09-04 DIAGNOSIS — M797 Fibromyalgia: Secondary | ICD-10-CM

## 2024-09-04 DIAGNOSIS — Z1589 Genetic susceptibility to other disease: Secondary | ICD-10-CM

## 2024-09-04 DIAGNOSIS — Z780 Asymptomatic menopausal state: Principal | ICD-10-CM

## 2024-09-04 DIAGNOSIS — M5416 Radiculopathy, lumbar region: Secondary | ICD-10-CM

## 2024-09-04 DIAGNOSIS — K529 Noninfective gastroenteritis and colitis, unspecified: Secondary | ICD-10-CM

## 2024-09-04 DIAGNOSIS — M8589 Other specified disorders of bone density and structure, multiple sites: Secondary | ICD-10-CM

## 2024-09-04 DIAGNOSIS — R7982 Elevated C-reactive protein (CRP): Secondary | ICD-10-CM

## 2024-09-04 DIAGNOSIS — M25551 Pain in right hip: Secondary | ICD-10-CM

## 2024-09-04 DIAGNOSIS — M2550 Pain in unspecified joint: Secondary | ICD-10-CM

## 2024-09-04 DIAGNOSIS — M47812 Spondylosis without myelopathy or radiculopathy, cervical region: Secondary | ICD-10-CM

## 2024-09-04 DIAGNOSIS — D4819 Leiomyomatosis: Secondary | ICD-10-CM

## 2024-09-04 MED ORDER — VENLAFAXINE IMMEDIATE RELEASE 75 MG TABLET
75 | ORAL_TABLET | ORAL | 1 refills | 30.00000 days | Status: AC
Start: 2024-09-04 — End: ?

## 2024-09-04 MED ORDER — GABAPENTIN 300 MG CAPSULE
300 | ORAL_CAPSULE | Freq: Every evening | ORAL | 1 refills | 30.00000 days | Status: AC
Start: 2024-09-04 — End: ?

## 2024-09-04 NOTE — Telephone Encounter [36]
 RN spoke with pt to get updated labs, pt said she will go this week.Last visit: 8/05/2025Next visit: 10/02/2024 & 8/12/2026Last labs:Lab Results Component Value Date  WBC 5.6 03/09/2024  HGB 13.4 03/09/2024  HCT 41.0 03/09/2024  MCV 98.1 03/09/2024  MCH 32.1 03/09/2024  MCHC 32.7 03/09/2024  PLT 301 03/09/2024  MPV 9.2 03/09/2024  NEUTROPHILS 59.5 03/09/2024  LYMPHOCYTES 31.8 03/09/2024  MONOCYTES 7.1 03/09/2024  EOSINOPHILS 0.9 03/09/2024  Lab Results Component Value Date  NA 141 03/09/2024  K 3.9 03/09/2024  CL 104 03/09/2024  CO2 27 03/09/2024  GLU 88 03/09/2024  BUN 13 03/09/2024  CREATININE 0.82 03/09/2024  EGFR >60 02/14/2013  EGFRAFRAMER 91 11/26/2020  CALCIUM 9.4 03/09/2024  ALBUMIN 4.3 03/09/2024  PROT 7.0 12/28/2018  BILITOT 0.6 03/09/2024  ALKPHOS 39 03/09/2024  ALT 15 03/09/2024  AST 13 03/09/2024  GLOB 2.4 03/09/2024  Lab Results Component Value Date  CRPQ <3.0 03/09/2024  SEDRATE 2 03/09/2024

## 2024-09-06 LAB — CBC AND DIFFERENTIAL
BASOPHILS ABSOLUTE COUNT: 37 {cells}/uL (ref 0–200)
BASOPHILS: 0.6 %
EOSINOPHILS ABSOLUTE COUNT: 62 {cells}/uL (ref 15–500)
EOSINOPHILS: 1 %
HEMATOCRIT BLOOD: 43.1 % (ref 35.9–46.0)
HEMOGLOBIN: 14.1 g/dL (ref 11.7–15.5)
LYMPHOCYTES ABSOLUTE COUNT: 1699 {cells}/uL (ref 850–3900)
LYMPHOCYTES: 27.4 %
MCH: 32.4 pg (ref 27.0–33.0)
MCHC-HEMOGLOBINOPATHY: 32.7 g/dL (ref 31.6–35.4)
MCV: 99.1 fL (ref 81.4–101.7)
MONOCYTES ABSOLUTE COUNT: 533 {cells}/uL (ref 200–950)
MONOCYTES: 8.6 %
MPV: 9.6 fL (ref 7.5–12.5)
NEUTROPHILS ABSOLUTE COUNT: 3869 {cells}/uL (ref 1500–7800)
NEUTROPHILS: 62.4 %
PLATELET COUNT: 331 Thousand/uL (ref 140–400)
RDW: 12.5 % (ref 11.0–15.0)
RED BLOOD CELL COUNT: 4.35 Million/uL (ref 3.80–5.10)
WHITE BLOOD CELL COUNT: 6.2 Thousand/uL (ref 3.8–10.8)

## 2024-09-06 LAB — COMPREHENSIVE METABOLIC PANEL
ALBUMIN/GLOBULIN RATIO: 2 (calc) (ref 1.0–2.5)
ALBUMIN: 4.7 g/dL (ref 3.6–5.1)
ALKALINE PHOSPHATASE: 41 U/L (ref 37–153)
ALT (SGPT): 17 U/L (ref 6–29)
AST (SGOT): 17 U/L (ref 10–35)
BILIRUBIN, TOTAL: 0.6 mg/dL (ref 0.2–1.2)
BLOOD UREA NITROGEN: 20 mg/dL (ref 7–25)
CALCIUM: 9.2 mg/dL (ref 8.6–10.4)
CHLORIDE: 104 mmol/L (ref 98–110)
CO2: 28 mmol/L (ref 20–32)
CREATININE: 0.73 mg/dL (ref 0.50–1.05)
EGFR, CREATININE (CKD-EPI 2021) QUEST: 94 mL/min/1.73m2 (ref >=60)
GLOBULIN: 2.4 g/dL (ref 1.9–3.7)
GLUCOSE: 83 mg/dL (ref 65–139)
POTASSIUM: 4.7 mmol/L (ref 3.5–5.3)
PROTEIN, TOTAL, SPEP: 7.1 g/dL (ref 6.1–8.1)
SODIUM: 140 mmol/L (ref 135–146)

## 2024-09-06 LAB — VITAMIN D, 25-HYDROXY: VITAMIN D, 25 OH, TOTAL: 38 ng/mL (ref 30–100)

## 2024-09-06 LAB — C-REACTIVE PROTEIN     (CRP): C-REACTIVE PROTEIN: 4 mg/L (ref <8.0)

## 2024-09-06 LAB — SEDIMENTATION RATE (ESR): SED RATE BY MODIFIED WESTERGREN: 6 mm/h (ref <=30)

## 2024-10-02 ENCOUNTER — Encounter: Admit: 2024-10-02 | Payer: PRIVATE HEALTH INSURANCE | Attending: Rheumatology

## 2024-10-02 DIAGNOSIS — M25551 Pain in right hip: Secondary | ICD-10-CM

## 2024-10-02 DIAGNOSIS — M797 Fibromyalgia: Principal | ICD-10-CM

## 2024-10-02 DIAGNOSIS — M47812 Spondylosis without myelopathy or radiculopathy, cervical region: Secondary | ICD-10-CM

## 2024-10-02 DIAGNOSIS — Z1589 Genetic susceptibility to other disease: Secondary | ICD-10-CM

## 2024-10-02 DIAGNOSIS — R7982 Elevated C-reactive protein (CRP): Secondary | ICD-10-CM

## 2024-10-02 DIAGNOSIS — M5416 Radiculopathy, lumbar region: Secondary | ICD-10-CM

## 2024-10-02 DIAGNOSIS — E559 Vitamin D deficiency, unspecified: Secondary | ICD-10-CM

## 2024-10-02 DIAGNOSIS — M8589 Other specified disorders of bone density and structure, multiple sites: Secondary | ICD-10-CM

## 2024-10-02 DIAGNOSIS — M2550 Pain in unspecified joint: Secondary | ICD-10-CM

## 2024-10-02 MED ORDER — BUDESONIDE DR - ER 3 MG CAPSULE,DELAYED,EXTENDED RELEASE
3 | Freq: Every morning | ORAL | 3.00 refills | 30.00000 days | Status: AC
Start: 2024-10-02 — End: ?
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
